# Patient Record
Sex: Female | Born: 1950 | Race: White | Hispanic: No | Marital: Married | Smoking: Never smoker
Health system: Southern US, Community
[De-identification: ages and names within clinical notes are randomized; demographics above are authoritative.]

## PROBLEM LIST (undated history)

## (undated) DIAGNOSIS — F419 Anxiety disorder, unspecified: Secondary | ICD-10-CM

## (undated) DIAGNOSIS — K5790 Diverticulosis of intestine, part unspecified, without perforation or abscess without bleeding: Secondary | ICD-10-CM

## (undated) DIAGNOSIS — K5900 Constipation, unspecified: Secondary | ICD-10-CM

## (undated) DIAGNOSIS — Z6832 Body mass index (BMI) 32.0-32.9, adult: Secondary | ICD-10-CM

## (undated) DIAGNOSIS — J309 Allergic rhinitis, unspecified: Secondary | ICD-10-CM

## (undated) DIAGNOSIS — R12 Heartburn: Secondary | ICD-10-CM

## (undated) DIAGNOSIS — K219 Gastro-esophageal reflux disease without esophagitis: Secondary | ICD-10-CM

## (undated) DIAGNOSIS — I6529 Occlusion and stenosis of unspecified carotid artery: Secondary | ICD-10-CM

## (undated) DIAGNOSIS — E559 Vitamin D deficiency, unspecified: Secondary | ICD-10-CM

## (undated) DIAGNOSIS — R1310 Dysphagia, unspecified: Secondary | ICD-10-CM

## (undated) DIAGNOSIS — E669 Obesity, unspecified: Secondary | ICD-10-CM

## (undated) DIAGNOSIS — I2510 Atherosclerotic heart disease of native coronary artery without angina pectoris: Secondary | ICD-10-CM

## (undated) DIAGNOSIS — Z780 Asymptomatic menopausal state: Secondary | ICD-10-CM

## (undated) DIAGNOSIS — R0602 Shortness of breath: Secondary | ICD-10-CM

## (undated) DIAGNOSIS — J302 Other seasonal allergic rhinitis: Secondary | ICD-10-CM

## (undated) DIAGNOSIS — I10 Essential (primary) hypertension: Secondary | ICD-10-CM

## (undated) DIAGNOSIS — H9190 Unspecified hearing loss, unspecified ear: Secondary | ICD-10-CM

## (undated) DIAGNOSIS — E785 Hyperlipidemia, unspecified: Secondary | ICD-10-CM

## (undated) DIAGNOSIS — K829 Disease of gallbladder, unspecified: Secondary | ICD-10-CM

## (undated) DIAGNOSIS — Z9622 Myringotomy tube(s) status: Secondary | ICD-10-CM

## (undated) HISTORY — DX: Gastro-esophageal reflux disease without esophagitis: K21.9

## (undated) HISTORY — DX: Myringotomy tube(s) status: Z96.22

## (undated) HISTORY — DX: Vitamin D deficiency, unspecified: E55.9

## (undated) HISTORY — DX: Occlusion and stenosis of unspecified carotid artery: I65.29

## (undated) HISTORY — DX: Constipation, unspecified: K59.00

## (undated) HISTORY — DX: Unspecified hearing loss, unspecified ear: H91.90

## (undated) HISTORY — DX: Obesity, unspecified: E66.9

## (undated) HISTORY — DX: Disease of gallbladder, unspecified: K82.9

## (undated) HISTORY — DX: Asymptomatic menopausal state: Z78.0

## (undated) HISTORY — DX: Heartburn: R12

## (undated) HISTORY — DX: Shortness of breath: R06.02

## (undated) HISTORY — DX: Hyperlipidemia, unspecified: E78.5

## (undated) HISTORY — DX: Dysphagia, unspecified: R13.10

## (undated) HISTORY — DX: Anxiety disorder, unspecified: F41.9

## (undated) HISTORY — DX: Allergic rhinitis, unspecified: J30.9

## (undated) HISTORY — DX: Diverticulosis of intestine, part unspecified, without perforation or abscess without bleeding: K57.90

## (undated) HISTORY — DX: Essential (primary) hypertension: I10

## (undated) HISTORY — DX: Atherosclerotic heart disease of native coronary artery without angina pectoris: I25.10

## (undated) HISTORY — DX: Other seasonal allergic rhinitis: J30.2

## (undated) HISTORY — PX: TONSILLECTOMY: SUR1361

## (undated) HISTORY — DX: Body mass index (BMI) 32.0-32.9, adult: Z68.32

## (undated) HISTORY — PX: EXTERNAL EAR SURGERY: SHX627

---

## 1998-10-18 ENCOUNTER — Other Ambulatory Visit: Admission: RE | Admit: 1998-10-18 | Discharge: 1998-10-18 | Payer: Self-pay | Admitting: Obstetrics and Gynecology

## 1999-12-10 ENCOUNTER — Other Ambulatory Visit: Admission: RE | Admit: 1999-12-10 | Discharge: 1999-12-10 | Payer: Self-pay | Admitting: Obstetrics and Gynecology

## 2001-05-20 ENCOUNTER — Other Ambulatory Visit: Admission: RE | Admit: 2001-05-20 | Discharge: 2001-05-20 | Payer: Self-pay | Admitting: Obstetrics and Gynecology

## 2001-09-06 ENCOUNTER — Encounter: Admission: RE | Admit: 2001-09-06 | Discharge: 2001-09-06 | Payer: Self-pay

## 2001-09-10 ENCOUNTER — Encounter: Payer: Self-pay | Admitting: Obstetrics and Gynecology

## 2001-09-10 ENCOUNTER — Ambulatory Visit (HOSPITAL_COMMUNITY): Admission: RE | Admit: 2001-09-10 | Discharge: 2001-09-10 | Payer: Self-pay | Admitting: Obstetrics and Gynecology

## 2002-08-30 ENCOUNTER — Other Ambulatory Visit: Admission: RE | Admit: 2002-08-30 | Discharge: 2002-08-30 | Payer: Self-pay | Admitting: Obstetrics and Gynecology

## 2002-09-05 ENCOUNTER — Encounter: Payer: Self-pay | Admitting: Obstetrics and Gynecology

## 2002-09-05 ENCOUNTER — Ambulatory Visit (HOSPITAL_COMMUNITY): Admission: RE | Admit: 2002-09-05 | Discharge: 2002-09-05 | Payer: Self-pay | Admitting: Obstetrics and Gynecology

## 2003-02-02 ENCOUNTER — Ambulatory Visit (HOSPITAL_COMMUNITY): Admission: RE | Admit: 2003-02-02 | Discharge: 2003-02-02 | Payer: Self-pay | Admitting: Surgery

## 2003-02-13 ENCOUNTER — Ambulatory Visit (HOSPITAL_COMMUNITY): Admission: RE | Admit: 2003-02-13 | Discharge: 2003-02-13 | Payer: Self-pay | Admitting: Family Medicine

## 2003-02-13 ENCOUNTER — Encounter: Payer: Self-pay | Admitting: Surgery

## 2003-10-10 ENCOUNTER — Other Ambulatory Visit: Admission: RE | Admit: 2003-10-10 | Discharge: 2003-10-10 | Payer: Self-pay | Admitting: Obstetrics and Gynecology

## 2004-05-27 ENCOUNTER — Ambulatory Visit (HOSPITAL_COMMUNITY): Admission: RE | Admit: 2004-05-27 | Discharge: 2004-05-27 | Payer: Self-pay | Admitting: Surgery

## 2004-06-04 ENCOUNTER — Ambulatory Visit (HOSPITAL_COMMUNITY): Admission: RE | Admit: 2004-06-04 | Discharge: 2004-06-04 | Payer: Self-pay | Admitting: Surgery

## 2005-02-11 ENCOUNTER — Other Ambulatory Visit: Admission: RE | Admit: 2005-02-11 | Discharge: 2005-02-11 | Payer: Self-pay | Admitting: Obstetrics and Gynecology

## 2006-05-12 HISTORY — PX: PARATHYROIDECTOMY: SHX19

## 2006-09-22 ENCOUNTER — Encounter: Admission: RE | Admit: 2006-09-22 | Discharge: 2006-09-22 | Payer: Self-pay | Admitting: Surgery

## 2006-10-15 ENCOUNTER — Ambulatory Visit (HOSPITAL_COMMUNITY): Admission: RE | Admit: 2006-10-15 | Discharge: 2006-10-16 | Payer: Self-pay | Admitting: Surgery

## 2006-10-15 ENCOUNTER — Encounter (INDEPENDENT_AMBULATORY_CARE_PROVIDER_SITE_OTHER): Payer: Self-pay | Admitting: Surgery

## 2007-10-11 IMAGING — CR DG CHEST 2V
2 series · 2 of 2 positions shown · non-contrast
Comparison: none

CLINICAL DATA: Primary hyperparathyroidism.  Hypertension.  Preop work-up. 
 CHEST - 2 VIEW:

[view not recorded (1 of 2)]
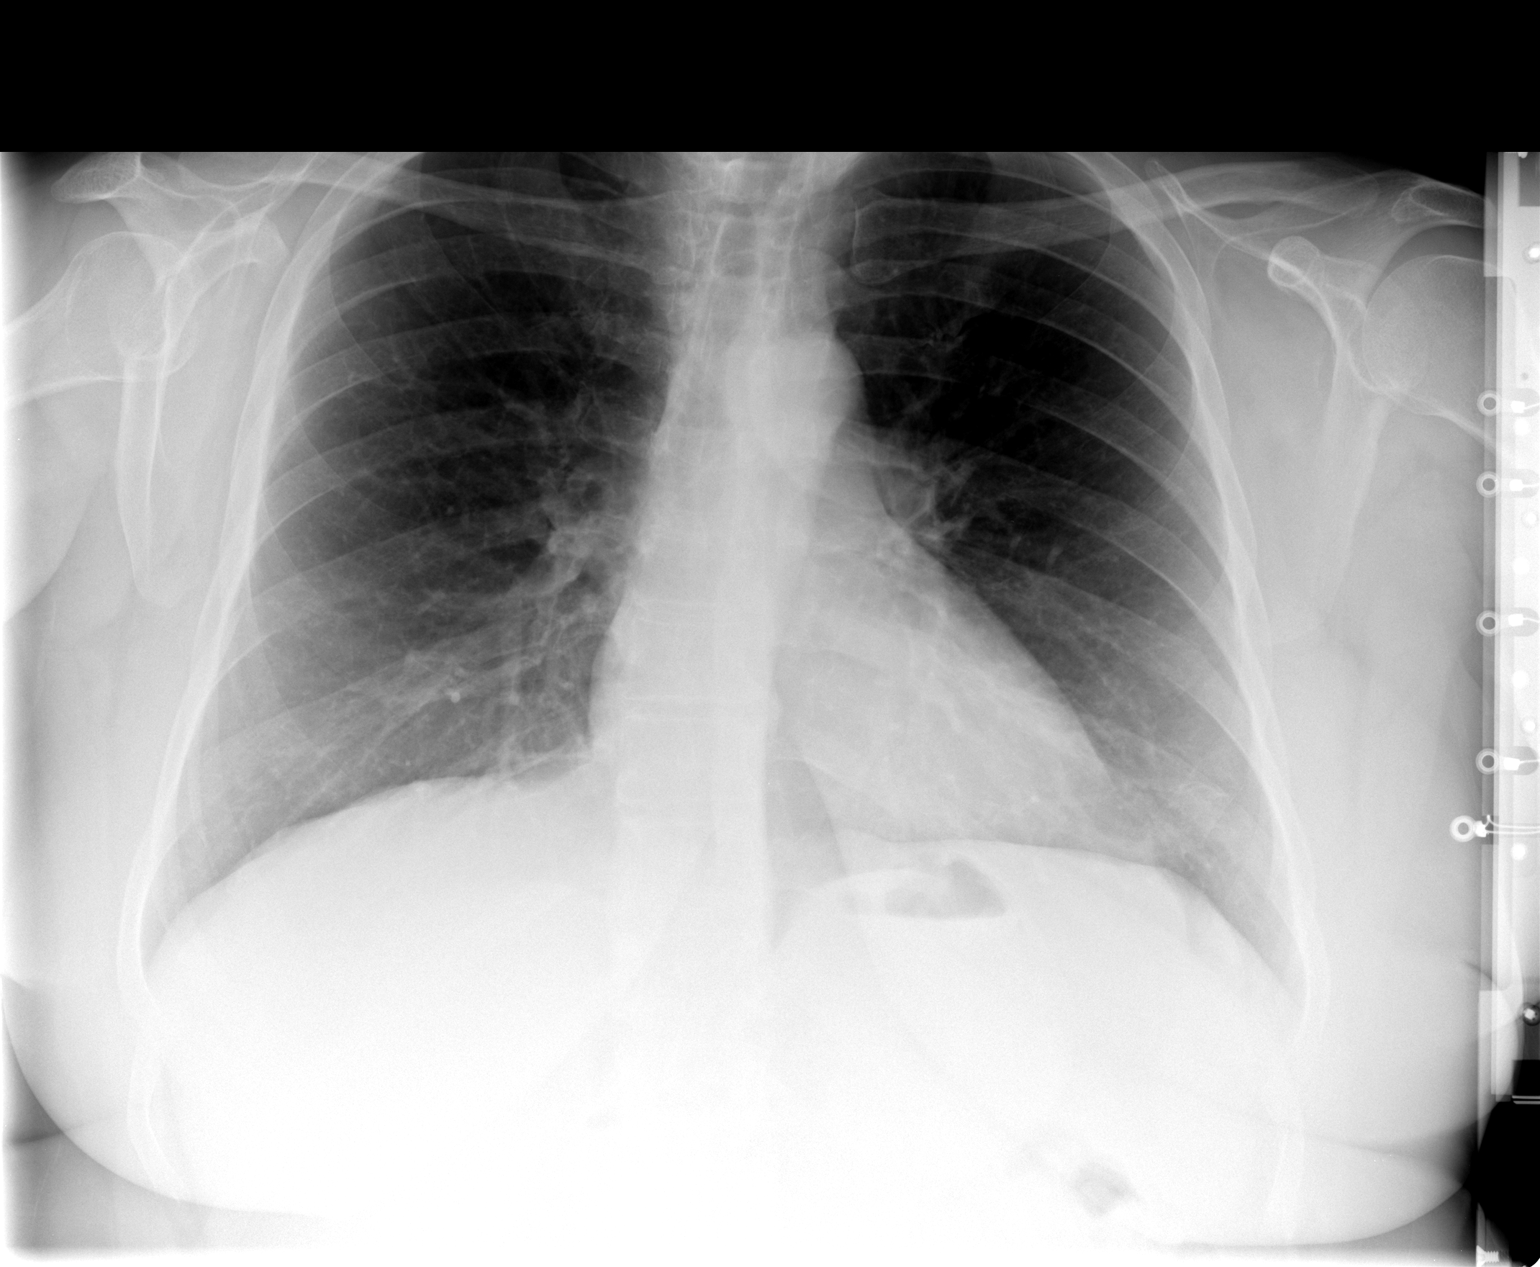

[view not recorded (2 of 2)]
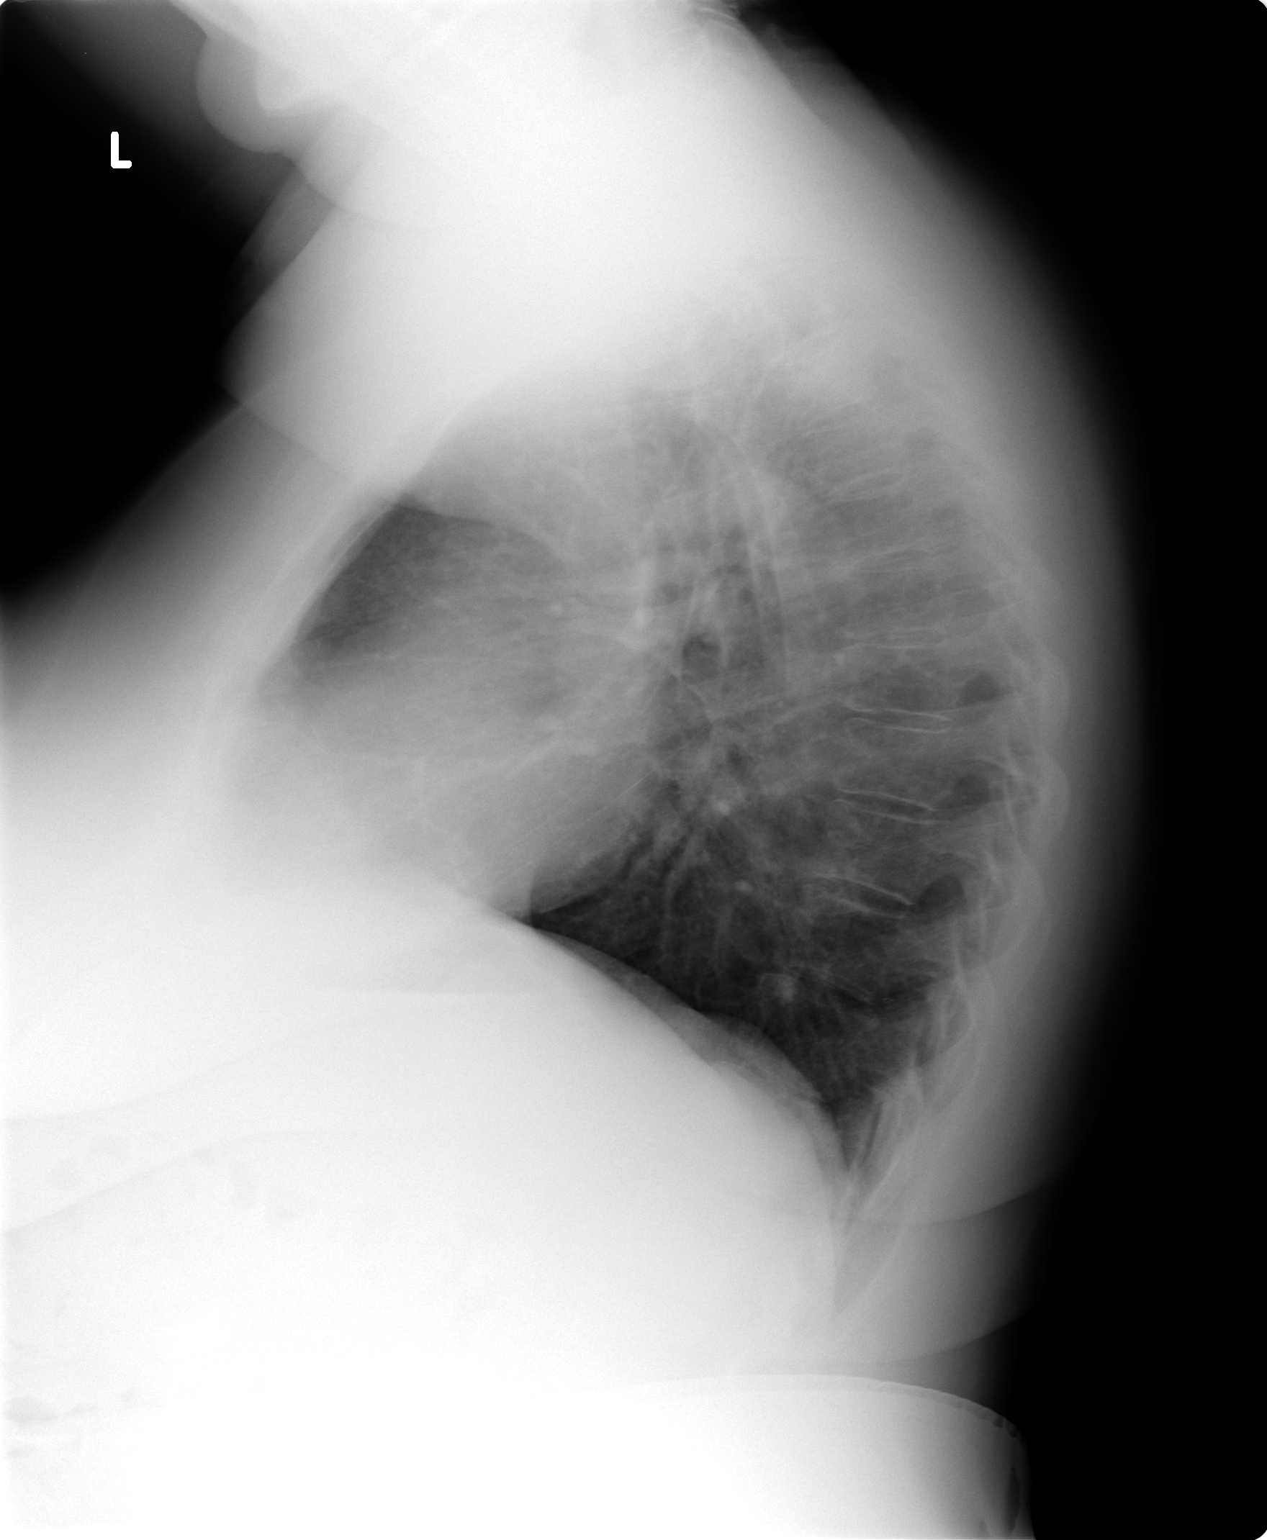

[2 of 2 positions shown; findings below may reference images not displayed]

FINDINGS: The heart size and mediastinal contours are within normal limits.  Both lungs are clear.  The visualized skeletal structures are unremarkable.
IMPRESSION: No active cardiopulmonary disease.

## 2010-09-24 NOTE — Op Note (Signed)
Victoria Torres, Victoria Torres               ACCOUNT NO.:  [id number]   MEDICAL RECORD NO.:  [id number]          PATIENT TYPE:  AMB   LOCATION:  DAY                          FACILITY:  [facility]   PHYSICIAN:  Velora Heckler, MD      DATE OF BIRTH:  [birth year]   DATE OF PROCEDURE:  10/15/2006  DATE OF DISCHARGE:                               OPERATIVE REPORT   PREOPERATIVE DIAGNOSIS:  Primary hyperparathyroidism.   POSTOPERATIVE DIAGNOSIS:  Primary hyperparathyroidism.   PROCEDURE:  Right inferior minimally invasive parathyroidectomy.   SURGEON:  Velora Heckler, M.D., FACS   ANESTHESIA:  General.   ESTIMATED BLOOD LOSS:  Minimal.   PREPARATION:  Betadine.   COMPLICATIONS:  None.   INDICATIONS:  The patient is a 60 year old white female who has been  followed for hypercalcemia in my practice for a number of years.  The  patient had continued elevation of calcium to 10.8.  A recent intact PTH  level was elevated at 230.  At my request, she underwent a sestamibi  scan at [facility] on Sep 22, 2006.  This showed abnormal  radiotracer uptake in the right inferior position consistent with  parathyroid adenoma.  The patient now comes to surgery for minimally  invasive resection.   BODY OF REPORT:  The procedure is done in OR 3 at the [facility].  The patient is brought to the operating room and placed in a  supine position on the operating room table.  Following administration  of general anesthesia, the patient is positioned and then prepped and  draped in the usual strict aseptic fashion.  After ascertaining that an  adequate level of anesthesia had been obtained, a right inferior neck  incision was made with a #15 blade.  Dissection was carried through  subcutaneous tissues and platysma.  Hemostasis was obtained with  electrocautery.  Subplatysmal flaps were developed circumferentially.  A  Weitlaner retractor was placed for exposure.  Strap muscles were incised  in the  midline and reflected laterally.  The inferior pole of the right  thyroid lobe was exposed.  With gentle dissection in the  tracheoesophageal groove, the right lobe was is mobilized cephalad and  anteriorly.  Immediately posterior to the right thyroid lobe is an  abnormally enlarged parathyroid gland.  This was gently dissected out.  Vascular pedicles were divided between small Liga clips.  The gland is  excised off the posterior aspect of the inferior portion of the right  thyroid lobe.  The entire gland is excised.  It measures approximately 1  cm in greatest dimension.  It is submitted to pathology where frozen  section confirms parathyroid tissue consistent with adenoma.  Good  hemostasis is noted.  A small piece of Surgicel was placed in the  operative field.  The strap muscles were reapproximated in the midline  with interrupted 3-0 Vicryl sutures.  The platysma was closed with  interrupted 3-0 Vicryl sutures.  The skin was anesthetized with local  Marcaine.  The skin was closed  with a running 4-0 Monocryl subcuticular suture.  The wound  is washed  and dried and Benzoin and Steri-Strips were applied.  Sterile dressings  were applied.  The patient is awakened from anesthesia and brought to  the recovery room in stable condition.  The patient tolerated the  procedure well.      Velora Heckler, MD  Electronically Signed     TMG/MEDQ  D:  10/15/2006  T:  10/15/2006  Job:  [id number]   cc:   Juline Patch, M.D.  Fax: [phone]

## 2011-02-27 LAB — URINALYSIS, ROUTINE W REFLEX MICROSCOPIC
Bilirubin Urine: NEGATIVE
Hgb urine dipstick: NEGATIVE
Ketones, ur: NEGATIVE
Protein, ur: NEGATIVE
Urobilinogen, UA: 0.2

## 2011-02-27 LAB — PROTIME-INR: Prothrombin Time: 12.2

## 2011-02-27 LAB — DIFFERENTIAL
Basophils Absolute: 0
Eosinophils Absolute: 0.1
Eosinophils Relative: 1
Lymphocytes Relative: 30

## 2011-02-27 LAB — BASIC METABOLIC PANEL
BUN: 12
GFR calc non Af Amer: >60
Glucose, Bld: 98
Potassium: 4.1

## 2011-02-27 LAB — CBC
HCT: 44.7
MCV: 84.5
Platelets: 385
RDW: 13.2

## 2013-05-19 ENCOUNTER — Other Ambulatory Visit: Payer: Self-pay | Admitting: Obstetrics and Gynecology

## 2014-10-20 ENCOUNTER — Other Ambulatory Visit: Payer: Self-pay | Admitting: Obstetrics and Gynecology

## 2014-10-23 LAB — CYTOLOGY - PAP

## 2015-08-20 ENCOUNTER — Other Ambulatory Visit: Payer: Self-pay

## 2016-01-11 HISTORY — PX: CHOLECYSTECTOMY: SHX55

## 2016-01-25 DIAGNOSIS — Z09 Encounter for follow-up examination after completed treatment for conditions other than malignant neoplasm: Secondary | ICD-10-CM | POA: Insufficient documentation

## 2016-01-25 HISTORY — DX: Encounter for follow-up examination after completed treatment for conditions other than malignant neoplasm: Z09

## 2016-03-04 ENCOUNTER — Other Ambulatory Visit: Payer: Self-pay | Admitting: Obstetrics and Gynecology

## 2016-03-05 LAB — CYTOLOGY - PAP

## 2018-07-30 ENCOUNTER — Other Ambulatory Visit: Payer: Self-pay

## 2018-07-30 NOTE — Patient Outreach (Signed)
[facility]) Care Management  07/30/2018  ZULY LOMBARDOZZI May 09, 1951 [id number]   Medication Adherence call to Mrs. Keanah Safer left a message for patient to call back patient is due on Atorvastatin 10 mg under [employer].   Lillia Abed CPhT Pharmacy Technician [facility] Management Direct Dial [phone]  Fax [phone] [email or web address]

## 2021-11-11 DIAGNOSIS — E7800 Pure hypercholesterolemia, unspecified: Secondary | ICD-10-CM | POA: Insufficient documentation

## 2021-11-11 DIAGNOSIS — I10 Essential (primary) hypertension: Secondary | ICD-10-CM | POA: Insufficient documentation

## 2021-11-11 HISTORY — DX: Pure hypercholesterolemia, unspecified: E78.00

## 2021-11-11 HISTORY — DX: Essential (primary) hypertension: I10

## 2022-02-18 ENCOUNTER — Other Ambulatory Visit: Payer: Self-pay

## 2022-02-18 DIAGNOSIS — R5383 Other fatigue: Secondary | ICD-10-CM

## 2022-02-18 HISTORY — DX: Other fatigue: R53.83

## 2022-02-19 ENCOUNTER — Other Ambulatory Visit: Payer: Self-pay

## 2022-02-19 DIAGNOSIS — E785 Hyperlipidemia, unspecified: Secondary | ICD-10-CM | POA: Insufficient documentation

## 2022-02-19 DIAGNOSIS — K219 Gastro-esophageal reflux disease without esophagitis: Secondary | ICD-10-CM | POA: Insufficient documentation

## 2022-02-19 DIAGNOSIS — Z780 Asymptomatic menopausal state: Secondary | ICD-10-CM | POA: Insufficient documentation

## 2022-02-19 DIAGNOSIS — I10 Essential (primary) hypertension: Secondary | ICD-10-CM | POA: Insufficient documentation

## 2022-02-19 DIAGNOSIS — Z6832 Body mass index (BMI) 32.0-32.9, adult: Secondary | ICD-10-CM | POA: Insufficient documentation

## 2022-02-19 DIAGNOSIS — E669 Obesity, unspecified: Secondary | ICD-10-CM | POA: Insufficient documentation

## 2022-02-19 DIAGNOSIS — R1310 Dysphagia, unspecified: Secondary | ICD-10-CM | POA: Insufficient documentation

## 2022-02-19 DIAGNOSIS — J309 Allergic rhinitis, unspecified: Secondary | ICD-10-CM | POA: Insufficient documentation

## 2022-02-19 DIAGNOSIS — I2510 Atherosclerotic heart disease of native coronary artery without angina pectoris: Secondary | ICD-10-CM | POA: Insufficient documentation

## 2022-02-19 DIAGNOSIS — E559 Vitamin D deficiency, unspecified: Secondary | ICD-10-CM | POA: Insufficient documentation

## 2022-02-20 ENCOUNTER — Encounter: Payer: Self-pay | Admitting: Cardiology

## 2022-02-20 ENCOUNTER — Ambulatory Visit: Payer: Medicare Other | Attending: Cardiology | Admitting: Cardiology

## 2022-02-20 VITALS — BP 132/68 | HR 71 | Ht 59.5 in | Wt 159.0 lb

## 2022-02-20 DIAGNOSIS — R072 Precordial pain: Secondary | ICD-10-CM

## 2022-02-20 DIAGNOSIS — E7800 Pure hypercholesterolemia, unspecified: Secondary | ICD-10-CM | POA: Diagnosis not present

## 2022-02-20 DIAGNOSIS — I2510 Atherosclerotic heart disease of native coronary artery without angina pectoris: Secondary | ICD-10-CM | POA: Diagnosis not present

## 2022-02-20 DIAGNOSIS — I10 Essential (primary) hypertension: Secondary | ICD-10-CM | POA: Diagnosis not present

## 2022-02-20 MED ORDER — METOPROLOL TARTRATE 100 MG PO TABS: 100.0000 mg | ORAL_TABLET | Freq: Once | ORAL | 0 refills | Status: DC

## 2022-02-20 NOTE — Patient Instructions (Signed)
Medication Instructions:  Your physician recommends that you continue on your current medications as directed. Please refer to the Current Medication list given to you today.  *If you need a refill on your cardiac medications before your next appointment, please call your pharmacy*   Lab Work: Your physician recommends that you return for lab work in:   Labs today: BMP, Lpa Labs 1 week before CT: BMP  If you have labs (blood work) drawn today and your tests are completely normal, you will receive your results only by: MyChart Message (if you have MyChart) OR A paper copy in the mail If you have any lab test that is abnormal or we need to change your treatment, we will call you to review the results.   Testing/Procedures: Your physician has requested that you have cardiac CT. Cardiac computed tomography (CT) is a painless test that uses an x-ray machine to take clear, detailed pictures of your heart. For further information please visit [email or web address] Please follow instruction sheet as given.    Your Cardiac CT will be scheduled at:   [facility] located off [facility] at the hospital.  Please arrive 30 minutes prior to your appointment time.  You can use the FREE valet parking offered at entrance to outpatient center (encouraged to control the heart rate for the test)   Please follow these instructions carefully (unless otherwise directed):  On the Night Before the Test: Be sure to Drink plenty of water. Do not consume any caffeinated/decaffeinated beverages or chocolate 12 hours prior to your test. Do not take any antihistamines 12 hours prior to your test.  On the Day of the Test: Drink plenty of water until 1 hour prior to the test. Do not eat any food 4 hours prior to the test. No smoking 4 hours prior to test. You may take your regular medications prior to the test.  Take metoprolol (Lopressor) two hours prior to test. FEMALES- please wear  underwire-free bra if available, avoid dresses & tight clothing. Wear plain shirt no beads, sparkles, rhinestones, metal or heavy embroidery.  After the Test: Drink plenty of water. After receiving IV contrast, you may experience a mild flushed feeling. This is normal. On occasion, you may experience a mild rash up to 24 hours after the test. This is not dangerous. If this occurs, you can take Benadryl 25 mg and increase your fluid intake. If you experience trouble breathing, this can be serious. If it is severe call 911 IMMEDIATELY. If it is mild, please call our office. If you take any of these medications: Glipizide/Metformin, Avandament, Glucavance, please do not take 48 hours after completing test unless otherwise instructed.  We will call to schedule your test 2-4 weeks out understanding that some insurance companies will need an authorization prior to the service being performed.      Follow-Up: At [facility], you and your health needs are our priority.  As part of our continuing mission to provide you with exceptional heart care, we have created designated Provider Care Teams.  These Care Teams include your primary Cardiologist (physician) and Advanced Practice Providers (APPs -  Physician Assistants and Nurse Practitioners) who all work together to provide you with the care you need, when you need it.  We recommend signing up for the patient portal called "MyChart".  Sign up information is provided on this After Visit Summary.  MyChart is used to connect with patients for Virtual Visits (Telemedicine).  Patients are able to  view lab/test results, encounter notes, upcoming appointments, etc.  Non-urgent messages can be sent to your provider as well.   To learn more about what you can do with MyChart, go to [email or web address].    Your next appointment:   8 week(s)  The format for your next appointment:   In Person  Provider:   Norman Herrlich, MD    Other  Instructions Cardiac CT Angiogram A cardiac CT angiogram is a procedure to look at the heart and the area around the heart. It may be done to help find the cause of chest pains or other symptoms of heart disease. During this procedure, a substance called contrast dye is injected into the blood vessels in the area to be checked. A large X-ray machine, called a CT scanner, then takes detailed pictures of the heart and the surrounding area. The procedure is also sometimes called a coronary CT angiogram, coronary artery scanning, or CTA. A cardiac CT angiogram allows the health care provider to see how well blood is flowing to and from the heart. The health care provider will be able to see if there are any problems, such as: Blockage or narrowing of the coronary arteries in the heart. Fluid around the heart. Signs of weakness or disease in the muscles, valves, and tissues of the heart. Tell a health care provider about: Any allergies you have. This is especially important if you have had a previous allergic reaction to contrast dye. All medicines you are taking, including vitamins, herbs, eye drops, creams, and over-the-counter medicines. Any blood disorders you have. Any surgeries you have had. Any medical conditions you have. Whether you are pregnant or may be pregnant. Any anxiety disorders, chronic pain, or other conditions you have that may increase your stress or prevent you from lying still. What are the risks? Generally, this is a safe procedure. However, problems may occur, including: Bleeding. Infection. Allergic reactions to medicines or dyes. Damage to other structures or organs. Kidney damage from the contrast dye that is used. Increased risk of cancer from radiation exposure. This risk is low. Talk with your health care provider about: The risks and benefits of testing. How you can receive the lowest dose of radiation. What happens before the procedure? Wear comfortable clothing  and remove any jewelry, glasses, dentures, and hearing aids. Follow instructions from your health care provider about eating and drinking. This may include: For 12 hours before the procedure -- avoid caffeine. This includes tea, coffee, soda, energy drinks, and diet pills. Drink plenty of water or other fluids that do not have caffeine in them. Being well hydrated can prevent complications. For 4-6 hours before the procedure -- stop eating and drinking. The contrast dye can cause nausea, but this is less likely if your stomach is empty. Ask your health care provider about changing or stopping your regular medicines. This is especially important if you are taking diabetes medicines, blood thinners, or medicines to treat problems with erections (erectile dysfunction). What happens during the procedure?  Hair on your chest may need to be removed so that small sticky patches called electrodes can be placed on your chest. These will transmit information that helps to monitor your heart during the procedure. An IV will be inserted into one of your veins. You might be given a medicine to control your heart rate during the procedure. This will help to ensure that good images are obtained. You will be asked to lie on an exam table. This table will slide  in and out of the CT machine during the procedure. Contrast dye will be injected into the IV. You might feel warm, or you may get a metallic taste in your mouth. You will be given a medicine called nitroglycerin. This will relax or dilate the arteries in your heart. The table that you are lying on will move into the CT machine tunnel for the scan. The person running the machine will give you instructions while the scans are being done. You may be asked to: Keep your arms above your head. Hold your breath. Stay very still, even if the table is moving. When the scanning is complete, you will be moved out of the machine. The IV will be removed. The procedure  may vary among health care providers and hospitals. What can I expect after the procedure? After your procedure, it is common to have: A metallic taste in your mouth from the contrast dye. A feeling of warmth. A headache from the nitroglycerin. Follow these instructions at home: Take over-the-counter and prescription medicines only as told by your health care provider. If you are told, drink enough fluid to keep your urine pale yellow. This will help to flush the contrast dye out of your body. Most people can return to their normal activities right after the procedure. Ask your health care provider what activities are safe for you. It is up to you to get the results of your procedure. Ask your health care provider, or the department that is doing the procedure, when your results will be ready. Keep all follow-up visits as told by your health care provider. This is important. Contact a health care provider if: You have any symptoms of allergy to the contrast dye. These include: Shortness of breath. Rash or hives. A racing heartbeat. Summary A cardiac CT angiogram is a procedure to look at the heart and the area around the heart. It may be done to help find the cause of chest pains or other symptoms of heart disease. During this procedure, a large X-ray machine, called a CT scanner, takes detailed pictures of the heart and the surrounding area after a contrast dye has been injected into blood vessels in the area. Ask your health care provider about changing or stopping your regular medicines before the procedure. This is especially important if you are taking diabetes medicines, blood thinners, or medicines to treat erectile dysfunction. If you are told, drink enough fluid to keep your urine pale yellow. This will help to flush the contrast dye out of your body. This information is not intended to replace advice given to you by your health care provider. Make sure you discuss any questions you  have with your health care provider. Document Revised: 08/15/2021 Document Reviewed: 12/22/2018 Elsevier Patient Education  [place]

## 2022-02-20 NOTE — Progress Notes (Signed)
Cardiology Office Note:    Date:  02/20/2022   ID:  Victoria Torres, DOB 11-26-1950, MRN [id number]  PCP:  Hurshel Party, NP  Cardiologist:  Norman Herrlich, MD   Referring MD: Hurshel Party, NP  ASSESSMENT:    1. Coronary artery calcification seen on CT scan   2. Hypertension, essential   3. Hypercholesterolemia    PLAN:    In order of problems listed above:  Although not quantitative the description certainly is consistent with severe coronary artery calcification on CT scan likely associated with a calcium score greater than 300 coronary equivalent and with her history of chest pain and previous medical interactions will undergo cardiac CTA to define the presence or absence of obstructive CAD and guide therapy in the event she did need revascularization.  For now she will continue both aspirin and her statin. Blood pressures are well controlled she has a valid device good technique and averages 120-125/70-74 continue her beta-blocker and ACE inhibitor Continue her high intensity statin clearly indicated with coronary artery calcification if she has CAD we can add Zetia to achieve LDLs less than 70 if she does not tolerate higher dose Lipitor with muscle pain I told her to reduce the dose back to 10 mg daily  Next appointment 6-8 weeks   Medication Adjustments/Labs and Tests Ordered: Current medicines are reviewed at length with the patient today.  Concerns regarding medicines are outlined above.  No orders of the defined types were placed in this encounter.  No orders of the defined types were placed in this encounter.    Chief Complaint  Patient presents with   CAC    History of Present Illness:    Victoria Torres is a 71 y.o. female hypertension hyperlipidemia and lung nodule who is being seen today for the evaluation of coronary artery calcification on CT scan at the request of Moon, Amy A, NP.  She has a chest CT performed 01/23/2021 that showed coronary artery  calcification left anterior descending and right coronary artery.  Chart review shows no previous cardiology evaluation or diagnostic cardiac imaging  She is quite concerned and even anxious as she has heart disease.  She has a strong family history of CAD and has been on a statin somewhere in the range of 8 years.  She has no known history of heart disease congenital rheumatic or atrial fibrillation. She has no exertional chest pain shortness of breath palpitation or syncope but she has had episodes of what she calls indigestion with discomfort up into her chest and tells me about 2 years ago she was seen at [facility] urgent care with an episode of chest pain EKG was normal. Although there is not a quantitative calcium score of the chest CT certainly describing severe coronary calcification generally associated with a calcium score greater than 300 coronary artery equivalent should remain on aspirin should remain on a statin and in view of her chest pain should undergo an ischemia evaluation we discussed option she would like to do cardiac CTA and will be performed at [facility].  She has no dye allergy or renal insufficiency.  I will also screen her for LP(a) we will recheck a BMP today. Past Medical History:  Diagnosis Date   Adult-onset obesity    Allergic rhinitis    Atherosclerosis of native coronary artery of native heart without angina pectoris    BMI 32.0-32.9,adult    Dyslipidemia    Dysphagia    Fatigue 02/18/2022  GERD without esophagitis    Hypercholesterolemia 11/11/2021   Hypertension, essential    Hypertensive disorder 11/11/2021   Postmenopausal    Postoperative examination 01/25/2016   Vitamin D deficiency     Past Surgical History:  Procedure Laterality Date   CHOLECYSTECTOMY  01/2016   PARATHYROIDECTOMY  2008   TONSILLECTOMY      Current Medications: Current Meds  Medication Sig   Ashwagandha 500 MG CAPS Take 500 mg by mouth daily.   aspirin EC 81 MG  tablet Take 81 mg by mouth daily.   atorvastatin (LIPITOR) 20 MG tablet Take 20 mg by mouth daily.   benazepril (LOTENSIN) 20 MG tablet Take 30 mg by mouth daily.   Calcium Carb-Cholecalciferol 500-3.125 MG-MCG TABS Take 1 tablet by mouth daily.   Cholecalciferol 25 MCG (1000 UT) tablet Take 2,000 Units by mouth daily.   fluticasone (FLONASE) 50 MCG/ACT nasal spray Place 1 spray into both nostrils as needed for allergies or rhinitis.   loratadine (CLARITIN) 10 MG tablet Take 1 tablet by mouth daily.   Multiple Vitamin (MULTIVITAMIN PO) Take 1 Pools by mouth daily.   omeprazole (PRILOSEC) 20 MG capsule Take 20 mg by mouth daily.   Probiotic Product (PROBIOTIC PO) Take 1 tablet by mouth daily.   psyllium (METAMUCIL) 58.6 % packet Take 1 packet by mouth daily.   zinc gluconate 50 MG tablet Take 50 mg by mouth daily.     Allergies:   Sulfamethoxazole   Social History   Socioeconomic History   Marital status: Married    Spouse name: Not on file   Number of children: Not on file   Years of education: Not on file   Highest education level: Not on file  Occupational History   Not on file  Tobacco Use   Smoking status: Never   Smokeless tobacco: Never  Substance and Sexual Activity   Alcohol use: Never   Drug use: Never   Sexual activity: Not on file  Other Topics Concern   Not on file  Social History Narrative   Not on file   Social Determinants of Health   Financial Resource Strain: Not on file  Food Insecurity: Not on file  Transportation Needs: Not on file  Physical Activity: Not on file  Stress: Not on file  Social Connections: Not on file     Family History: The patient's family history includes Arthritis in her father and mother; Colon cancer in her maternal grandmother; Diabetes in her mother; Emphysema in her mother; Heart disease in her father; Hyperlipidemia in her mother; Hypertension in her mother; Lung cancer in her father.  ROS:   ROS Please see the history  of present illness.     All other systems reviewed and are negative.  EKGs/Labs/Other Studies Reviewed:    The following studies were reviewed today:   EKG:  EKG is  ordered today.  The ekg ordered today is personally reviewed and demonstrates sinus rhythm late transition in the precordial leads otherwise normal EKG    Physical Exam:    VS:  BP (!) 150/84 (BP Location: Left Arm, Patient Position: Sitting, Cuff Size: Normal)   Ht 4' 11.5" (1.511 m)   Wt 159 lb (72.1 kg)   BMI 31.58 kg/m     Wt Readings from Last 3 Encounters:  02/20/22 159 lb (72.1 kg)    She has no xanthoma or xanthelasma GEN:  Well nourished, well developed in no acute distress HEENT: Normal NECK: No JVD; No carotid  bruits LYMPHATICS: No lymphadenopathy CARDIAC: RRR, no murmurs, rubs, gallops RESPIRATORY:  Clear to auscultation without rales, wheezing or rhonchi  ABDOMEN: Soft, non-tender, non-distended MUSCULOSKELETAL:  No edema; No deformity  SKIN: Warm and dry NEUROLOGIC:  Alert and oriented x 3 PSYCHIATRIC:  Normal affect     Signed, Shirlee More, MD  02/20/2022 9:50 AM    [place]

## 2022-02-21 LAB — BASIC METABOLIC PANEL
BUN/Creatinine Ratio: 19 (ref 12–28)
BUN: 14 mg/dL (ref 8–27)
CO2: 25 mmol/L (ref 20–29)
Calcium: 9.5 mg/dL (ref 8.7–10.3)
Chloride: 99 mmol/L (ref 96–106)
Creatinine, Ser: 0.74 mg/dL (ref 0.57–1.00)
Glucose: 89 mg/dL (ref 70–99)
Potassium: 4.3 mmol/L (ref 3.5–5.2)
Sodium: 139 mmol/L (ref 134–144)
eGFR: 88 mL/min/{1.73_m2} (ref >59)

## 2022-02-21 LAB — LIPOPROTEIN A (LPA): Lipoprotein (a): <8.4 nmol/L (ref <75.0)

## 2022-02-25 ENCOUNTER — Telehealth: Payer: Self-pay

## 2022-02-26 ENCOUNTER — Telehealth: Payer: Self-pay | Admitting: Cardiology

## 2022-02-26 NOTE — Telephone Encounter (Signed)
Patient is returning phone call about results. 

## 2022-03-20 ENCOUNTER — Telehealth: Payer: Self-pay | Admitting: Cardiology

## 2022-03-20 NOTE — Telephone Encounter (Signed)
Spoke with pt. She was seen in the office by Dr. Dulce Sellar on 02-20-22 and she had not heard from CT to schedule CT angiogram. [facility] did not have her scheduled. Sent to Prec[facility]- no precert needed- AH. Forms and records are ready to send to [facility] after a signature. Pt aware. She has Metoprolol and knows to get bloodwork done 1 week prior.

## 2022-03-20 NOTE — Telephone Encounter (Signed)
Patient called stating she called [facility] hospital to schedule her CT Angio and they told her the nurse needs to call to schedule that.

## 2022-04-01 ENCOUNTER — Telehealth: Payer: Self-pay | Admitting: Cardiology

## 2022-04-01 LAB — BASIC METABOLIC PANEL
BUN/Creatinine Ratio: 27 (ref 12–28)
BUN: 20 mg/dL (ref 8–27)
CO2: 27 mmol/L (ref 20–29)
Calcium: 9.3 mg/dL (ref 8.7–10.3)
Chloride: 101 mmol/L (ref 96–106)
Creatinine, Ser: 0.73 mg/dL (ref 0.57–1.00)
Glucose: 90 mg/dL (ref 70–99)
Potassium: 4.4 mmol/L (ref 3.5–5.2)
Sodium: 139 mmol/L (ref 134–144)
eGFR: 89 mL/min/{1.73_m2} (ref >59)

## 2022-04-01 NOTE — Telephone Encounter (Signed)
Gave patient lab results per Dr. Hulen Shouts note.

## 2022-04-01 NOTE — Telephone Encounter (Signed)
    Pt is returning call to get lab results 

## 2022-04-17 ENCOUNTER — Encounter: Payer: Self-pay | Admitting: Cardiology

## 2022-04-17 NOTE — Progress Notes (Signed)
Cardiology Office Note:    Date:  04/18/2022   ID:  Victoria Torres, DOB 1950/08/27, MRN [id number]  PCP:  Hurshel Party, NP  Cardiologist:  Norman Herrlich, MD    Referring MD: Hurshel Party, NP    ASSESSMENT:    1. Mild CAD   2. Agatston coronary artery calcium score between 200 and 399   3. Hypertension, essential   4. Hypercholesterolemia    PLAN:    In order of problems listed above:  Evaluate cardiac CTA as the data given in the opportunities for preventative cardiology care coronary score is quite high she will need to have effective lipid-lowering therapy likely will need Zetia plus high intensity statin goal LDL less than 55 for best long-term outcome and please check LP(a) level. Stable hypertension she will continue with current effective treatment All of her lipids in her PCP office the beginning of February   Next appointment: She will see me in the office in follow-up 1 year   Medication Adjustments/Labs and Tests Ordered: Current medicines are reviewed at length with the patient today.  Concerns regarding medicines are outlined above.  No orders of the defined types were placed in this encounter.  Meds ordered this encounter  Medications   atorvastatin (LIPITOR) 20 MG tablet    Sig: Take 1 tablet (20 mg total) by mouth daily.    Dispense:  90 tablet    Refill:  3    Chief Complaint  Patient presents with   Follow-up    After cardiac CTA    History of Present Illness:    Victoria Torres is a 71 y.o. female with a hx of hypertension hyperlipidemia lung nodule and coronary artery calcification along Family history of CAD last seen 02/20/2022 and referred for cardiac CTA.  Cardiac CTA reported 04/08/2022 she had a calcium score of 272 64th percentile and nonobstructive CAD 25 to 49% mid left anterior descending coronary artery.  Compliance with diet, lifestyle and medications: Yes   She has very good healthcare knowledge and noted already looked at her  cardiac CTA reported unremarkable. Calcium score significantly elevated and for her the goal lipid-lowering therapy should be an LDL of 55 or less.  Dose of atorvastatin have been decreased and I told her to go back to 20 mg daily she has upcoming labs February PCP if LDL exceeds 55 I will place her on Zetia 10 mg daily with her statin and I like to have her screened for LP(a) excess.  Fortunately not having angina edema shortness of breath palpitation or syncope and no significant focal coronary stenosis Past Medical History:  Diagnosis Date   Adult-onset obesity    Allergic rhinitis    Atherosclerosis of native coronary artery of native heart without angina pectoris    BMI 32.0-32.9,adult    Dyslipidemia    Dysphagia    Fatigue 02/18/2022   GERD without esophagitis    Hypercholesterolemia 11/11/2021   Hypertension, essential    Hypertensive disorder 11/11/2021   Postmenopausal    Postoperative examination 01/25/2016   Vitamin D deficiency     Past Surgical History:  Procedure Laterality Date   CHOLECYSTECTOMY  01/2016   PARATHYROIDECTOMY  2008   TONSILLECTOMY      Current Medications: Current Meds  Medication Sig   Ashwagandha 500 MG CAPS Take 500 mg by mouth daily.   aspirin EC 81 MG tablet Take 81 mg by mouth daily.   atorvastatin (LIPITOR) 20 MG tablet Take 1  tablet (20 mg total) by mouth daily.   benazepril (LOTENSIN) 20 MG tablet Take 30 mg by mouth daily.   Calcium Carb-Cholecalciferol 500-3.125 MG-MCG TABS Take 1 tablet by mouth daily.   Cholecalciferol 25 MCG (1000 UT) tablet Take 2,000 Units by mouth daily.   fluticasone (FLONASE) 50 MCG/ACT nasal spray Place 1 spray into both nostrils as needed for allergies or rhinitis.   loratadine (CLARITIN) 10 MG tablet Take 1 tablet by mouth daily.   metoprolol tartrate (LOPRESSOR) 100 MG tablet Take 1 tablet (100 mg total) by mouth once for 1 dose. Please take 2 hours prior to CT   Multiple Vitamin (MULTIVITAMIN PO) Take 1  Pools by mouth daily.   omeprazole (PRILOSEC) 20 MG capsule Take 20 mg by mouth daily.   Probiotic Product (PROBIOTIC PO) Take 1 tablet by mouth daily.   psyllium (METAMUCIL) 58.6 % packet Take 1 packet by mouth daily.   zinc gluconate 50 MG tablet Take 50 mg by mouth daily.   [DISCONTINUED] atorvastatin (LIPITOR) 20 MG tablet Take 10 mg by mouth daily.     Allergies:   Sulfamethoxazole   Social History   Socioeconomic History   Marital status: Married    Spouse name: Not on file   Number of children: Not on file   Years of education: Not on file   Highest education level: Not on file  Occupational History   Not on file  Tobacco Use   Smoking status: Never   Smokeless tobacco: Never  Substance and Sexual Activity   Alcohol use: Never   Drug use: Never   Sexual activity: Not on file  Other Topics Concern   Not on file  Social History Narrative   Not on file   Social Determinants of Health   Financial Resource Strain: Not on file  Food Insecurity: Not on file  Transportation Needs: Not on file  Physical Activity: Not on file  Stress: Not on file  Social Connections: Not on file     Family History: The patient's family history includes Arthritis in her father and mother; Colon cancer in her maternal grandmother; Diabetes in her mother; Emphysema in her mother; Heart disease in her father; Hyperlipidemia in her mother; Hypertension in her mother; Lung cancer in her father. ROS:   Please see the history of present illness.    All other systems reviewed and are negative.  EKGs/Labs/Other Studies Reviewed:    The following studies were reviewed today:  12/02/2021 cholesterol 168 LDL 84  Physical Exam:    VS:  BP (!) 160/84 (BP Location: Left Arm, Patient Position: Sitting)   Pulse 81   Ht 4' 11.5" (1.511 m)   Wt 157 lb (71.2 kg)   SpO2 96%   BMI 31.18 kg/m     Wt Readings from Last 3 Encounters:  04/18/22 157 lb (71.2 kg)  02/20/22 159 lb (72.1 kg)      GEN:  Well nourished, well developed in no acute distress HEENT: Normal NECK: No JVD; No carotid bruits LYMPHATICS: No lymphadenopathy CARDIAC: RRR, no murmurs, rubs, gallops RESPIRATORY:  Clear to auscultation without rales, wheezing or rhonchi  ABDOMEN: Soft, non-tender, non-distended MUSCULOSKELETAL:  No edema; No deformity  SKIN: Warm and dry NEUROLOGIC:  Alert and oriented x 3 PSYCHIATRIC:  Normal affect    Signed, Norman Herrlich, MD  04/18/2022 8:45 AM    [facility]

## 2022-04-18 ENCOUNTER — Other Ambulatory Visit: Payer: Self-pay

## 2022-04-18 ENCOUNTER — Ambulatory Visit: Payer: Medicare Other | Attending: Cardiology | Admitting: Cardiology

## 2022-04-18 VITALS — BP 160/84 | HR 81 | Ht 59.5 in | Wt 157.0 lb

## 2022-04-18 DIAGNOSIS — I10 Essential (primary) hypertension: Secondary | ICD-10-CM

## 2022-04-18 DIAGNOSIS — I2510 Atherosclerotic heart disease of native coronary artery without angina pectoris: Secondary | ICD-10-CM

## 2022-04-18 DIAGNOSIS — R931 Abnormal findings on diagnostic imaging of heart and coronary circulation: Secondary | ICD-10-CM | POA: Diagnosis not present

## 2022-04-18 DIAGNOSIS — E7800 Pure hypercholesterolemia, unspecified: Secondary | ICD-10-CM | POA: Diagnosis not present

## 2022-04-18 MED ORDER — ATORVASTATIN CALCIUM 20 MG PO TABS: 20.0000 mg | ORAL_TABLET | Freq: Every day | ORAL | 3 refills | Status: DC

## 2022-04-18 NOTE — Patient Instructions (Signed)
Medication Instructions:  Your physician has recommended you make the following change in your medication:   START: Atorvastatin 20 mg daily  *If you need a refill on your cardiac medications before your next appointment, please call your pharmacy*   Lab Work: None If you have labs (blood work) drawn today and your tests are completely normal, you will receive your results only by: MyChart Message (if you have MyChart) OR A paper copy in the mail If you have any lab test that is abnormal or we need to change your treatment, we will call you to review the results.   Testing/Procedures: None   Follow-Up: At [facility], you and your health needs are our priority.  As part of our continuing mission to provide you with exceptional heart care, we have created designated Provider Care Teams.  These Care Teams include your primary Cardiologist (physician) and Advanced Practice Providers (APPs -  Physician Assistants and Nurse Practitioners) who all work together to provide you with the care you need, when you need it.  We recommend signing up for the patient portal called "MyChart".  Sign up information is provided on this After Visit Summary.  MyChart is used to connect with patients for Virtual Visits (Telemedicine).  Patients are able to view lab/test results, encounter notes, upcoming appointments, etc.  Non-urgent messages can be sent to your provider as well.   To learn more about what you can do with MyChart, go to [email or web address].    Your next appointment:   1 year(s)  The format for your next appointment:   In Person  Provider:   Norman Herrlich, MD    Other Instructions 20 - 30 minutes of activity a day.  Important Information About Sugar

## 2023-04-06 NOTE — Progress Notes (Unsigned)
Cardiology Office Note:    Date:  04/06/2023   ID:  Victoria Torres, DOB 21-Mar-1951, MRN [id number]  PCP:  Hurshel Party, NP  Cardiologist:  Norman Herrlich, MD    Referring MD: Hurshel Party, NP    ASSESSMENT:    1. Mild CAD   2. Agatston coronary artery calcium score between 200 and 399   3. Hypertension, essential   4. Hypercholesterolemia    PLAN:    In order of problems listed above:  ***   Next appointment: ***   Medication Adjustments/Labs and Tests Ordered: Current medicines are reviewed at length with the patient today.  Concerns regarding medicines are outlined above.  No orders of the defined types were placed in this encounter.  No orders of the defined types were placed in this encounter.    History of Present Illness:    Victoria Torres is a 72 y.o. female with a hx of mild CAD nonobstructive with a calcium score of 272/64th percentile hypertension hyperlipidemia and lung nodule last seen 04/18/2022. Compliance with diet, lifestyle and medications: *** Past Medical History:  Diagnosis Date   Adult-onset obesity    Allergic rhinitis    Atherosclerosis of native coronary artery of native heart without angina pectoris    BMI 32.0-32.9,adult    Dyslipidemia    Dysphagia    Fatigue 02/18/2022   GERD without esophagitis    Hypercholesterolemia 11/11/2021   Hypertension, essential    Hypertensive disorder 11/11/2021   Postmenopausal    Postoperative examination 01/25/2016   Vitamin D deficiency     Current Medications: Current Meds  Medication Sig   Ashwagandha 500 MG CAPS Take 500 mg by mouth daily.   aspirin EC 81 MG tablet Take 81 mg by mouth daily.   atorvastatin (LIPITOR) 20 MG tablet Take 1 tablet (20 mg total) by mouth daily.   benazepril (LOTENSIN) 20 MG tablet Take 30 mg by mouth daily.   Calcium Carb-Cholecalciferol 500-3.125 MG-MCG TABS Take 1 tablet by mouth daily.   Cholecalciferol 25 MCG (1000 UT) tablet Take 2,000 Units by mouth daily.    fluticasone (FLONASE) 50 MCG/ACT nasal spray Place 1 spray into both nostrils as needed for allergies or rhinitis.   loratadine (CLARITIN) 10 MG tablet Take 1 tablet by mouth daily.   Multiple Vitamin (MULTIVITAMIN PO) Take 1 Pools by mouth daily.   omeprazole (PRILOSEC) 20 MG capsule Take 20 mg by mouth daily.   Probiotic Product (PROBIOTIC PO) Take 1 tablet by mouth daily.   psyllium (METAMUCIL) 58.6 % packet Take 1 packet by mouth daily.   zinc gluconate 50 MG tablet Take 50 mg by mouth daily.   [DISCONTINUED] metoprolol tartrate (LOPRESSOR) 100 MG tablet Take 1 tablet (100 mg total) by mouth once for 1 dose. Please take 2 hours prior to CT      EKGs/Labs/Other Studies Reviewed:    The following studies were reviewed today:          Recent Labs: No results found for requested labs within last 365 days.  Recent Lipid Panel No results found for: "CHOL", "TRIG", "HDL", "CHOLHDL", "VLDL", "LDLCALC", "LDLDIRECT"  Physical Exam:    VS:  There were no vitals taken for this visit.    Wt Readings from Last 3 Encounters:  04/18/22 157 lb (71.2 kg)  02/20/22 159 lb (72.1 kg)     GEN: *** Well nourished, well developed in no acute distress HEENT: Normal NECK: No JVD; No carotid bruits LYMPHATICS: No  lymphadenopathy CARDIAC: ***RRR, no murmurs, rubs, gallops RESPIRATORY:  Clear to auscultation without rales, wheezing or rhonchi  ABDOMEN: Soft, non-tender, non-distended MUSCULOSKELETAL:  No edema; No deformity  SKIN: Warm and dry NEUROLOGIC:  Alert and oriented x 3 PSYCHIATRIC:  Normal affect    Signed, Norman Herrlich, MD  04/06/2023 8:25 PM    [facility]

## 2023-04-07 ENCOUNTER — Encounter: Payer: Self-pay | Admitting: Cardiology

## 2023-04-07 ENCOUNTER — Ambulatory Visit: Payer: Medicare Other | Attending: Cardiology | Admitting: Cardiology

## 2023-04-07 VITALS — BP 148/80 | HR 70 | Ht 59.5 in | Wt 155.0 lb

## 2023-04-07 DIAGNOSIS — I10 Essential (primary) hypertension: Secondary | ICD-10-CM

## 2023-04-07 DIAGNOSIS — R931 Abnormal findings on diagnostic imaging of heart and coronary circulation: Secondary | ICD-10-CM | POA: Diagnosis not present

## 2023-04-07 DIAGNOSIS — E7800 Pure hypercholesterolemia, unspecified: Secondary | ICD-10-CM

## 2023-04-07 DIAGNOSIS — I2510 Atherosclerotic heart disease of native coronary artery without angina pectoris: Secondary | ICD-10-CM | POA: Diagnosis not present

## 2023-04-07 NOTE — Patient Instructions (Signed)
Medication Instructions:  Your physician recommends that you continue on your current medications as directed. Please refer to the Current Medication list given to you today.  *If you need a refill on your cardiac medications before your next appointment, please call your pharmacy*   Lab Work: Your physician recommends that you return for lab work in:   Labs in AM: Apo B, Lipids  If you have labs (blood work) drawn today and your tests are completely normal, you will receive your results only by: MyChart Message (if you have MyChart) OR A paper copy in the mail If you have any lab test that is abnormal or we need to change your treatment, we will call you to review the results.   Testing/Procedures: None   Follow-Up: At [facility], you and your health needs are our priority.  As part of our continuing mission to provide you with exceptional heart care, we have created designated Provider Care Teams.  These Care Teams include your primary Cardiologist (physician) and Advanced Practice Providers (APPs -  Physician Assistants and Nurse Practitioners) who all work together to provide you with the care you need, when you need it.  We recommend signing up for the patient portal called "MyChart".  Sign up information is provided on this After Visit Summary.  MyChart is used to connect with patients for Virtual Visits (Telemedicine).  Patients are able to view lab/test results, encounter notes, upcoming appointments, etc.  Non-urgent messages can be sent to your provider as well.   To learn more about what you can do with MyChart, go to [email or web address].    Your next appointment:   6 month(s)  Provider:   Norman Herrlich, MD    Other Instructions None

## 2023-04-07 NOTE — Progress Notes (Signed)
Cardiology Office Note:    Date:  04/07/2023   ID:  [name], DOB 12/26/1950, MRN [id number]  PCP:  Hurshel Party, NP  Cardiologist:  Norman Herrlich, MD    Referring MD: Hurshel Party, NP    ASSESSMENT:    1. Mild CAD   2. Agatston coronary artery calcium score between 200 and 399   3. Hypertension, essential   4. Hypercholesterolemia    PLAN:    In order of problems listed above:  Stable CAD and no angina with aspirin and her current lipid-lowering therapy along with antihypertensive Recheck a fasting lipid profile in April be tomorrow decide whether to transition to rosuvastatin if LDL remains above target Hypertension well-controlled with ambulatory blood pressure measurement continue ACE inhibitor   Next appointment: 6 months   Medication Adjustments/Labs and Tests Ordered: Current medicines are reviewed at length with the patient today.  Concerns regarding medicines are outlined above.  Orders Placed This Encounter  Procedures   Lipid Profile   Apolipoprotein B   EKG 12-Lead   No orders of the defined types were placed in this encounter.    History of Present Illness:    Victoria Torres is a 72 y.o. female with a hx of CAD hypertension hyperlipidemia last seen 04/18/2022. Compliance with diet, lifestyle and medications: Yes  She has been meticulous with diet and exercise compliant with medications and quite frustrated that her lipids since she initiated Zetia to her statin have worsened cholesterol going from 1 50-134-165 LDL from 74-58-82. She is frustrated and questions whether it is a lab error or medication failure She is using generics I agree with her that I cannot make sense of this organ does have her come back in the office to repeat a fasting profile in ApoB If lipids clearly have worsened we will discontinue atorvastatin got high dose rosuvastatin and if ineffective consider statin intolerance and switch to bempedoic acid or Repatha Otherwise doing  well and not having angina shortness of breath edema chest pain palpitation or syncope Amatory blood pressures run less than 120 systolic Past Medical History:  Diagnosis Date   Adult-onset obesity    Allergic rhinitis    Atherosclerosis of native coronary artery of native heart without angina pectoris    BMI 32.0-32.9,adult    Dyslipidemia    Dysphagia    Fatigue 02/18/2022   GERD without esophagitis    Hypercholesterolemia 11/11/2021   Hypertension, essential    Hypertensive disorder 11/11/2021   Postmenopausal    Postoperative examination 01/25/2016   Vitamin D deficiency     Current Medications: Current Meds  Medication Sig   aspirin EC 81 MG tablet Take 81 mg by mouth daily.   atorvastatin (LIPITOR) 20 MG tablet Take 1 tablet (20 mg total) by mouth daily.   benazepril (LOTENSIN) 20 MG tablet Take 20 mg by mouth 2 (two) times daily.   busPIRone (BUSPAR) 5 MG tablet Take 5 mg by mouth 2 (two) times daily.   Calcium Carb-Cholecalciferol 500-3.125 MG-MCG TABS Take 1 tablet by mouth daily.   Cholecalciferol 25 MCG (1000 UT) tablet Take 2,000 Units by mouth daily.   ezetimibe (ZETIA) 10 MG tablet Take 10 mg by mouth daily.   fluticasone (FLONASE) 50 MCG/ACT nasal spray Place 1 spray into both nostrils as needed for allergies or rhinitis.   loratadine (CLARITIN) 10 MG tablet Take 1 tablet by mouth daily.   Multiple Vitamin (MULTIVITAMIN PO) Take 1 Pools by mouth daily.  omeprazole (PRILOSEC) 20 MG capsule Take 20 mg by mouth daily.   Probiotic Product (PROBIOTIC PO) Take 1 tablet by mouth daily.   psyllium (METAMUCIL) 58.6 % packet Take 1 packet by mouth daily.   [DISCONTINUED] Ashwagandha 500 MG CAPS Take 500 mg by mouth daily.   [DISCONTINUED] metoprolol tartrate (LOPRESSOR) 100 MG tablet Take 1 tablet (100 mg total) by mouth once for 1 dose. Please take 2 hours prior to CT   [DISCONTINUED] zinc gluconate 50 MG tablet Take 50 mg by mouth daily.      EKGs/Labs/Other Studies  Reviewed:    The following studies were reviewed today:      EKG Interpretation Date/Time:  Tuesday April 07 2023 15:59:31 EST Ventricular Rate:  70 PR Interval:  146 QRS Duration:  84 QT Interval:  406 QTC Calculation: 438 R Axis:   -8  Text Interpretation: Normal sinus rhythm Normal ECG When compared with ECG of 13-Oct-2006 14:04, Criteria for Inferior infarct are no longer Present Confirmed by Norman Herrlich ([id number]) on 04/07/2023 5:12:46 PM   Recent Labs: No results found for requested labs within last 365 days.  Recent Lipid Panel No results found for: "CHOL", "TRIG", "HDL", "CHOLHDL", "VLDL", "LDLCALC", "LDLDIRECT"  Physical Exam:    VS:  BP (!) 148/80 (BP Location: Left Arm, Patient Position: Sitting)   Pulse 70   Ht 4' 11.5" (1.511 m)   Wt 155 lb (70.3 kg)   SpO2 96%   BMI 30.78 kg/m     Wt Readings from Last 3 Encounters:  04/07/23 155 lb (70.3 kg)  04/18/22 157 lb (71.2 kg)  02/20/22 159 lb (72.1 kg)     GEN:  Well nourished, well developed in no acute distress HEENT: Normal NECK: No JVD; No carotid bruits LYMPHATICS: No lymphadenopathy CARDIAC: RRR, no murmurs, rubs, gallops RESPIRATORY:  Clear to auscultation without rales, wheezing or rhonchi  ABDOMEN: Soft, non-tender, non-distended MUSCULOSKELETAL:  No edema; No deformity  SKIN: Warm and dry NEUROLOGIC:  Alert and oriented x 3 PSYCHIATRIC:  Normal affect    Signed, Norman Herrlich, MD  04/07/2023 5:14 PM    [facility]

## 2023-04-09 LAB — LIPID PANEL
Chol/HDL Ratio: 2.3 {ratio} (ref 0.0–4.4)
Cholesterol, Total: 137 mg/dL (ref 100–199)
HDL: 60 mg/dL (ref >39)
LDL Chol Calc (NIH): 62 mg/dL (ref 0–99)
Triglycerides: 79 mg/dL (ref 0–149)
VLDL Cholesterol Cal: 15 mg/dL (ref 5–40)

## 2023-04-09 LAB — APOLIPOPROTEIN B: Apolipoprotein B: 66 mg/dL (ref <90)

## 2023-04-13 ENCOUNTER — Other Ambulatory Visit: Payer: Self-pay | Admitting: Cardiology

## 2023-08-18 HISTORY — PX: CATARACT EXTRACTION: SUR2

## 2023-11-05 ENCOUNTER — Ambulatory Visit: Admitting: Cardiology

## 2023-11-26 ENCOUNTER — Encounter (INDEPENDENT_AMBULATORY_CARE_PROVIDER_SITE_OTHER): Payer: Self-pay | Admitting: *Deleted

## 2023-11-27 ENCOUNTER — Ambulatory Visit

## 2023-11-27 VITALS — BP 149/85 | HR 77 | Ht 59.0 in | Wt 159.0 lb

## 2023-11-27 DIAGNOSIS — E7800 Pure hypercholesterolemia, unspecified: Secondary | ICD-10-CM | POA: Diagnosis not present

## 2023-11-27 DIAGNOSIS — R918 Other nonspecific abnormal finding of lung field: Secondary | ICD-10-CM | POA: Insufficient documentation

## 2023-11-27 DIAGNOSIS — I2510 Atherosclerotic heart disease of native coronary artery without angina pectoris: Secondary | ICD-10-CM

## 2023-11-27 DIAGNOSIS — I10 Essential (primary) hypertension: Secondary | ICD-10-CM

## 2023-11-27 HISTORY — DX: Other nonspecific abnormal finding of lung field: R91.8

## 2023-11-27 NOTE — Assessment & Plan Note (Signed)
 Mild nonobstructive coronary artery disease on cardiac CT November 2023.  Continue with aspirin 81 mg once daily and atorvastatin  20 mg once daily. Good functional status asymptomatic.

## 2023-11-27 NOTE — Progress Notes (Signed)
 Normal coronary neurologic  Cardiology Consultation:    Date:  11/27/2023   ID:  Victoria Torres, DOB 31-Mar-1951, MRN [id number]  PCP:  Erick Greig LABOR, NP  Cardiologist:  Alean JONELLE Kobus, MD   Referring MD: Erick Greig LABOR, NP   No chief complaint on file.    ASSESSMENT AND PLAN:   Victoria Torres 73 year old woman with history of mild coronary artery disease [cardiac CT coronary angiogram at [facility] November 2023 CAD RADS 2 study with calcium  score 272, 4 mm right middle lobe pulmonary nodule], hypertension, hyperlipidemia former smoker [smoked for over 2 years about 50 years ago; has history of lung cancer in her father]. I do not see follow-up CT chest imaging study after her cardiac CT to follow-up on lung nodules. Here for follow-up visit doing well no cardiac symptoms.  Blood pressures at home well-controlled in comparison to office visits.   Problem List Items Addressed This Visit     Hypercholesterolemia   Lipid panel November 03, 2023 noted well-controlled levels. Continue atorvastatin  20 mg once daily and Zetia 10 mg once daily. She is diligent with her diet modifications and regular exercise. Congratulated her on improved lipid levels.        Atherosclerosis of native coronary artery of native heart without angina pectoris - Primary   Mild nonobstructive coronary artery disease on cardiac CT November 2023.  Continue with aspirin 81 mg once daily and atorvastatin  20 mg once daily. Good functional status asymptomatic.       Hypertension, essential   Elevated readings here in the office but mentions home readings are better controlled. Continue with benazepril 20 mg twice daily. Continue monitoring at home until readings are consistently above 130/80 mmHg should inform us  so we can further titrate up her blood pressure medications.       Lung nodules   CAD cardiac CT November 2023 identified lung nodule 4 mm.  Given her history of smoking and family  history of lung cancer, follow-up imaging study I do not see anything available. Will order CT chest without contrast      Relevant Orders   CT Chest Wo Contrast    Return to clinic in 6 months.  History of Present Illness:    Victoria Torres is a 73 y.o. female who is being seen today for follow-up visit. PCP is Moon, Amy A, NP. Last visit with us  in the office was 04/07/2023 with Dr. Monetta.  Has mild coronary artery disease [cardiac CT coronary angiogram at [facility] November 2023 CAD RADS 2 study with calcium  score 272, 4 mm right middle lobe pulmonary nodule], hypertension, hyperlipidemia.  Very pleasant woman here for the visit by herself mentions she keeps herself active working 4-5 times a week on elliptical and modifying her diet and keeps herself active. No cardiac symptoms.  Very diligent about monitoring her diet and cholesterol numbers and has been showing improvement as reviewed on recent blood work  Lipid panel 04/08/2023 with total cholesterol 137, triglycerides 79, LDL 62, apolipoprotein B 66, HDL 60. Recent blood work repeat from 11/03/2023 noted on [facility] noted total cholesterol 148, HDL 68, LDL 65, triglycerides 80. CMP 11/03/2023 BUN 16, creatinine 0.7, EGFR 93 and normal transaminitis and alkaline phosphatase  Continue with current medications. Blood pressures at home mentions have been well-controlled using a blood pressure cuff that has been calibrated at PCPs office with her husband's recent visit.    Past Medical History:  Diagnosis Date  Adult-onset obesity    Allergic rhinitis    Atherosclerosis of native coronary artery of native heart without angina pectoris    BMI 32.0-32.9,adult    Dyslipidemia    Dysphagia    Fatigue 02/18/2022   GERD without esophagitis    Hypercholesterolemia 11/11/2021   Hypertension, essential    Hypertensive disorder 11/11/2021   Postmenopausal    Postoperative examination 01/25/2016   Vitamin D  deficiency     Past Surgical History:  Procedure Laterality Date   CHOLECYSTECTOMY  01/2016   PARATHYROIDECTOMY  2008   TONSILLECTOMY      Current Medications: Current Meds  Medication Sig   aspirin EC 81 MG tablet Take 81 mg by mouth daily.   atorvastatin  (LIPITOR) 20 MG tablet Take 1 tablet (20 mg total) by mouth daily.   benazepril (LOTENSIN) 20 MG tablet Take 20 mg by mouth 2 (two) times daily.   busPIRone (BUSPAR) 5 MG tablet Take 5 mg by mouth 2 (two) times daily.   Calcium  Carb-Cholecalciferol 500-3.125 MG-MCG TABS Take 1 tablet by mouth daily.   Cholecalciferol 25 MCG (1000 UT) tablet Take 2,000 Units by mouth daily.   dicyclomine (BENTYL) 10 MG capsule as needed for spasms.   ezetimibe (ZETIA) 10 MG tablet Take 10 mg by mouth daily.   fluticasone (FLONASE) 50 MCG/ACT nasal spray Place 1 spray into both nostrils as needed for allergies or rhinitis.   loratadine (CLARITIN) 10 MG tablet Take 1 tablet by mouth daily.   Multiple Vitamin (MULTIVITAMIN PO) Take 1 Pools by mouth daily.   omeprazole (PRILOSEC) 20 MG capsule Take 20 mg by mouth daily.   Probiotic Product (PROBIOTIC PO) Take 1 tablet by mouth daily.   psyllium (METAMUCIL) 58.6 % packet Take 1 packet by mouth daily.     Allergies:   Sulfamethoxazole   Social History   Socioeconomic History   Marital status: Married    Spouse name: Not on file   Number of children: Not on file   Years of education: Not on file   Highest education level: Not on file  Occupational History   Not on file  Tobacco Use   Smoking status: Never   Smokeless tobacco: Never  Substance and Sexual Activity   Alcohol use: Never   Drug use: Never   Sexual activity: Not on file  Other Topics Concern   Not on file  Social History Narrative   Not on file   Social Drivers of Health   Financial Resource Strain: Not on file  Food Insecurity: Not on file  Transportation Needs: Not on file  Physical Activity: Not on file  Stress: Not  on file  Social Connections: Not on file     Family History: The patient's family history includes Arthritis in her father and mother; Colon cancer in her maternal grandmother; Diabetes in her mother; Emphysema in her mother; Heart disease in her father; Hyperlipidemia in her mother; Hypertension in her mother; Lung cancer in her father. ROS:   Please see the history of present illness.    All 14 point review of systems negative except as described per history of present illness.  EKGs/Labs/Other Studies Reviewed:    The following studies were reviewed today:   EKG:       Recent Labs: No results found for requested labs within last 365 days.  Recent Lipid Panel    Component Value Date/Time   CHOL 137 04/08/2023 0829   TRIG 79 04/08/2023 0829   HDL 60  04/08/2023 0829   CHOLHDL 2.3 04/08/2023 0829   LDLCALC 62 04/08/2023 0829    Physical Exam:    VS:  BP (!) 149/85 (BP Location: Right Arm)   Pulse 77   Ht 4' 11 (1.499 m)   Wt 159 lb (72.1 kg)   SpO2 99%   BMI 32.11 kg/m     Wt Readings from Last 3 Encounters:  11/27/23 159 lb (72.1 kg)  11/26/23 157 lb (71.2 kg)  04/07/23 155 lb (70.3 kg)     GENERAL:  Well nourished, well developed in no acute distress NECK: No JVD; No carotid bruits CARDIAC: RRR, S1 and S2 present, no murmurs, no rubs, no gallops CHEST:  Clear to auscultation without rales, wheezing or rhonchi  Extremities: No pitting pedal edema. Pulses bilaterally symmetric with radial 2+ and dorsalis pedis 2+ NEUROLOGIC:  Alert and oriented x 3  Medication Adjustments/Labs and Tests Ordered: Current medicines are reviewed at length with the patient today.  Concerns regarding medicines are outlined above.  Orders Placed This Encounter  Procedures   CT Chest Wo Contrast   No orders of the defined types were placed in this encounter.   Signed, Alean jess Kobus, MD, MPH, [facility]. 11/27/2023 11:46 AM    [facility]

## 2023-11-27 NOTE — Assessment & Plan Note (Signed)
 CAD cardiac CT November 2023 identified lung nodule 4 mm.  Given her history of smoking and family history of lung cancer, follow-up imaging study I do not see anything available. Will order CT chest without contrast

## 2023-11-27 NOTE — Patient Instructions (Signed)
 Medication Instructions:  Your physician recommends that you continue on your current medications as directed. Please refer to the Current Medication list given to you today.  *If you need a refill on your cardiac medications before your next appointment, please call your pharmacy*  Lab Work: None If you have labs (blood work) drawn today and your tests are completely normal, you will receive your results only by: MyChart Message (if you have MyChart) OR A paper copy in the mail If you have any lab test that is abnormal or we need to change your treatment, we will call you to review the results.  Testing/Procedures: CT-scan of the chest   Follow-Up: At [facility], you and your health needs are our priority.  As part of our continuing mission to provide you with exceptional heart care, our providers are all part of one team.  This team includes your primary Cardiologist (physician) and Advanced Practice Providers or APPs (Physician Assistants and Nurse Practitioners) who all work together to provide you with the care you need, when you need it.  Your next appointment:   6 month(s)  Provider:   Alean Kobus, MD    We recommend signing up for the patient portal called MyChart.  Sign up information is provided on this After Visit Summary.  MyChart is used to connect with patients for Virtual Visits (Telemedicine).  Patients are able to view lab/test results, encounter notes, upcoming appointments, etc.  Non-urgent messages can be sent to your provider as well.   To learn more about what you can do with MyChart, go to [email or web address].   Other Instructions None

## 2023-11-27 NOTE — Assessment & Plan Note (Signed)
 Elevated readings here in the office but mentions home readings are better controlled. Continue with benazepril 20 mg twice daily. Continue monitoring at home until readings are consistently above 130/80 mmHg should inform us  so we can further titrate up her blood pressure medications.

## 2023-11-27 NOTE — Assessment & Plan Note (Signed)
 Lipid panel November 03, 2023 noted well-controlled levels. Continue atorvastatin  20 mg once daily and Zetia 10 mg once daily. She is diligent with her diet modifications and regular exercise. Congratulated her on improved lipid levels.

## 2023-12-04 ENCOUNTER — Ambulatory Visit (HOSPITAL_BASED_OUTPATIENT_CLINIC_OR_DEPARTMENT_OTHER)
Admission: RE | Admit: 2023-12-04 | Discharge: 2023-12-04 | Disposition: A | Discharge status: Home / Self Care | Source: Ambulatory Visit

## 2023-12-04 DIAGNOSIS — R918 Other nonspecific abnormal finding of lung field: Secondary | ICD-10-CM

## 2023-12-09 ENCOUNTER — Encounter (INDEPENDENT_AMBULATORY_CARE_PROVIDER_SITE_OTHER): Payer: Self-pay | Admitting: Adult Health

## 2023-12-09 ENCOUNTER — Ambulatory Visit (INDEPENDENT_AMBULATORY_CARE_PROVIDER_SITE_OTHER): Admitting: Adult Health

## 2023-12-09 VITALS — BP 155/75 | HR 72 | Temp 98.6°F | Ht 59.0 in | Wt 155.0 lb

## 2023-12-09 DIAGNOSIS — I10 Essential (primary) hypertension: Secondary | ICD-10-CM | POA: Diagnosis not present

## 2023-12-09 DIAGNOSIS — E7800 Pure hypercholesterolemia, unspecified: Secondary | ICD-10-CM

## 2023-12-09 DIAGNOSIS — Z0000 Encounter for general adult medical examination without abnormal findings: Secondary | ICD-10-CM

## 2023-12-09 DIAGNOSIS — Z0289 Encounter for other administrative examinations: Secondary | ICD-10-CM

## 2023-12-09 DIAGNOSIS — E669 Obesity, unspecified: Secondary | ICD-10-CM

## 2023-12-09 DIAGNOSIS — E559 Vitamin D deficiency, unspecified: Secondary | ICD-10-CM | POA: Diagnosis not present

## 2023-12-09 DIAGNOSIS — Z6831 Body mass index (BMI) 31.0-31.9, adult: Secondary | ICD-10-CM

## 2023-12-09 DIAGNOSIS — I2510 Atherosclerotic heart disease of native coronary artery without angina pectoris: Secondary | ICD-10-CM | POA: Diagnosis not present

## 2023-12-09 NOTE — Progress Notes (Addendum)
 Office: [phone]  /  Fax: [phone]   Initial Visit    Victoria Torres was seen in clinic today to evaluate for obesity. She is interested in losing weight to improve overall health and reduce the risk of weight related complications. She presents today to review program treatment options, initial physical assessment, and evaluation.     She was referred by: PCP  When asked what else they would like to accomplish? She states: Adopt a healthier eating pattern and lifestyle, Improve energy levels and physical activity, Improve existing medical conditions, and Improve quality of life  When asked how has your weight affected you? She states: Contributed to medical problems and Contributed to orthopedic problems or mobility issues  Weight history: Weight has been a concern since high school.  Weight in high school was 150s- today's weight 155 lbs  Highest weight: 160 lbs  Some associated conditions: Hypertension, Hyperlipidemia, and Vitamin D Deficiency  Contributing factors: family history of obesity, use of obesogenic medications: Psychotropic medications, and moderate to high levels of stress  Weight promoting medications identified: Psychotropic medications  Prior weight loss attempts: Weight Watchers and Low Carb  Current nutrition plan: High-protein and Other: Limiting sugar/simple CHO/saturated fat  Current level of physical activity: Other: Elliptical at gym , 3-4 times per week  Current or previous pharmacotherapy: None  Response to medication: Never tried medications   Past medical history includes:   Past Medical History:  Diagnosis Date   Adult-onset obesity    Allergic rhinitis    Atherosclerosis of native coronary artery of native heart without angina pectoris    BMI 32.0-32.9,adult    Dyslipidemia    Dysphagia    Fatigue 02/18/2022   GERD without esophagitis    Hypercholesterolemia 11/11/2021   Hypertension, essential    Hypertensive disorder  11/11/2021   Postmenopausal    Postoperative examination 01/25/2016   Vitamin D deficiency      Objective    BP (!) 155/75   Pulse 72   Temp 98.6 F (37 C)   Ht 4' 11 (1.499 m)   Wt 155 lb (70.3 kg)   LMP  (LMP Unknown)   SpO2 99%   BMI 31.31 kg/m  She was weighed on the bioimpedance scale: Body mass index is 31.31 kg/m.  Body Fat%:42.6, Visceral Fat Rating:12, Weight trend over the last 12 months: Decreasing  General:  Alert, oriented and cooperative. Patient is in no acute distress.  Respiratory: Normal respiratory effort, no problems with respiration noted   Gait: able to ambulate independently  Mental Status: Normal mood and affect. Normal behavior. Normal judgment and thought content.   DIAGNOSTIC DATA REVIEWED:  BMET    Component Value Date/Time   NA 139 03/31/2022 0903   K 4.4 03/31/2022 0903   CL 101 03/31/2022 0903   CO2 27 03/31/2022 0903   GLUCOSE 90 03/31/2022 0903   GLUCOSE 98 10/13/2006 1400   BUN 20 03/31/2022 0903   CREATININE 0.73 03/31/2022 0903   CALCIUM  9.3 03/31/2022 0903   GFRNONAA >60 10/13/2006 1400   GFRAA  10/13/2006 1400    >60        The eGFR has been calculated using the MDRD equation. This calculation has not been validated in all clinical   No results found for: HGBA1C No results found for: INSULIN CBC    Component Value Date/Time   WBC 9.5 10/13/2006 1400   RBC 5.28 (H) 10/13/2006 1400   HGB 15.1 (H) 10/13/2006 1400   HCT  44.7 10/13/2006 1400   PLT 385 10/13/2006 1400   MCV 84.5 10/13/2006 1400   MCHC 33.8 10/13/2006 1400   RDW 13.2 10/13/2006 1400   Iron/TIBC/Ferritin/ %Sat No results found for: IRON, TIBC, FERRITIN, IRONPCTSAT Lipid Panel     Component Value Date/Time   CHOL 137 04/08/2023 0829   TRIG 79 04/08/2023 0829   HDL 60 04/08/2023 0829   CHOLHDL 2.3 04/08/2023 0829   LDLCALC 62 04/08/2023 0829   Hepatic Function Panel  No results found for: PROT, ALBUMIN, AST, ALT, ALKPHOS,  BILITOT, BILIDIR, IBILI No results found for: TSH   Assessment and Plan   Healthcare maintenance  Vitamin D deficiency  Atherosclerosis of native coronary artery of native heart without angina pectoris  Hypertension, essential  Hypercholesterolemia  Obesity (BMI 30-39.9), STARTING BMI 31.5   Assessment and Plan         ESTABLISH WITH [employer]   Obesity Treatment / Action Plan:  Patient will work on garnering support from family and friends to begin weight loss journey. Will work on eliminating or reducing the presence of highly palatable, calorie dense foods in the home. Will complete provided nutritional and psychosocial assessment questionnaire before the next appointment. Will be scheduled for indirect calorimetry to determine resting energy expenditure in a fasting state.  This will allow us  to create a reduced calorie, high-protein meal plan to promote loss of fat mass while preserving muscle mass. Counseled on the health benefits of losing 5%-15% of total body weight. Was counseled on nutritional approaches to weight loss and benefits of reducing processed foods and consuming plant-based foods and high quality protein as part of nutritional weight management. Was counseled on pharmacotherapy and role as an adjunct in weight management.   Obesity Education Performed Today:  She was weighed on the bioimpedance scale and results were discussed and documented in the synopsis.  We discussed obesity as a disease and the importance of a more detailed evaluation of all the factors contributing to the disease.  We discussed the importance of long term lifestyle changes which include nutrition, exercise and behavioral modifications as well as the importance of customizing this to her specific health and social needs.  We discussed the benefits of reaching a healthier weight to alleviate the symptoms of existing conditions and reduce the risks of the biomechanical, metabolic  and psychological effects of obesity.  We reviewed the four pillars of obesity medicine and importance of using a multimodal approach.  We reviewed the basic principles in weight management.   Victoria Torres appears to be in the action stage of change and states they are ready to start intensive lifestyle modifications and behavioral modifications.  I have spent 26 minutes in the care of the patient today including: 3 minutes before the visit reviewing and preparing the chart. 20 minutes face-to-face assessing and reviewing listed medical problems as outlined in obesity care plan, providing nutritional and behavioral counseling on topics outlined in the obesity care plan, counseling regarding anti-obesity medication as outlined in obesity care plan, independently interpreting test results and goals of care, as described in assessment and plan, and reviewing and discussing biometric information and progress 3 minutes after the visit updating chart and documentation of encounter.  Reviewed by clinician on day of visit: allergies, medications, problem list, medical history, surgical history, family history, social history, and previous encounter notes pertinent to obesity diagnosis.  Sharyon Peitz d. Audrionna Lampton, NP-c

## 2023-12-15 ENCOUNTER — Ambulatory Visit: Payer: Self-pay

## 2023-12-18 ENCOUNTER — Ambulatory Visit: Admitting: Cardiology

## 2023-12-22 ENCOUNTER — Institutional Professional Consult (permissible substitution) (INDEPENDENT_AMBULATORY_CARE_PROVIDER_SITE_OTHER): Admitting: Family Medicine

## 2024-02-10 ENCOUNTER — Ambulatory Visit (INDEPENDENT_AMBULATORY_CARE_PROVIDER_SITE_OTHER): Admitting: Family Medicine

## 2024-02-10 ENCOUNTER — Encounter (INDEPENDENT_AMBULATORY_CARE_PROVIDER_SITE_OTHER): Payer: Self-pay | Admitting: Family Medicine

## 2024-02-10 VITALS — BP 138/86 | HR 68 | Temp 97.6°F | Ht 59.0 in | Wt 153.0 lb

## 2024-02-10 DIAGNOSIS — R5383 Other fatigue: Secondary | ICD-10-CM

## 2024-02-10 DIAGNOSIS — I10 Essential (primary) hypertension: Secondary | ICD-10-CM | POA: Diagnosis not present

## 2024-02-10 DIAGNOSIS — E785 Hyperlipidemia, unspecified: Secondary | ICD-10-CM

## 2024-02-10 DIAGNOSIS — E669 Obesity, unspecified: Secondary | ICD-10-CM | POA: Diagnosis not present

## 2024-02-10 DIAGNOSIS — E7800 Pure hypercholesterolemia, unspecified: Secondary | ICD-10-CM

## 2024-02-10 DIAGNOSIS — R0602 Shortness of breath: Secondary | ICD-10-CM | POA: Insufficient documentation

## 2024-02-10 DIAGNOSIS — E559 Vitamin D deficiency, unspecified: Secondary | ICD-10-CM | POA: Diagnosis not present

## 2024-02-10 DIAGNOSIS — Z6830 Body mass index (BMI) 30.0-30.9, adult: Secondary | ICD-10-CM

## 2024-02-10 DIAGNOSIS — E66811 Obesity, class 1: Secondary | ICD-10-CM

## 2024-02-10 DIAGNOSIS — Z1331 Encounter for screening for depression: Secondary | ICD-10-CM

## 2024-02-10 NOTE — Progress Notes (Signed)
 Office: [phone]  /  Fax: [phone]  WEIGHT SUMMARY AND BIOMETRICS  Anthropometric Measurements Height: 4' 11 (1.499 m) Weight: 153 lb (69.4 kg) BMI (Calculated): 30.89 Weight at Last Visit: N/a Weight Lost Since Last Visit: N/a Weight Gained Since Last Visit: N/a Total Weight Loss (lbs): 153 lb (69.4 kg) Peak Weight: 209 lb Waist Measurement : 37.5 inches   Body Composition  Body Fat %: 41.1 % Fat Mass (lbs): 63.2 lbs Muscle Mass (lbs): 85.8 lbs Total Body Water (lbs): 60.6 lbs Visceral Fat Rating : 12   Other Clinical Data RMR: 1109 Fasting: Yes Labs: yes Today's Visit #: 1 Starting Date: 02/10/24    Chief Complaint: OBESITY    History of Present Illness Victoria Torres is a 73 year old female who presents for a workup to assess the most effective treatment option for weight management.  She has a history of weight gain, with her heaviest weight being 209 pounds, and she is currently at 150 pounds. Despite efforts to decrease saturated fats, increase protein, and consume low simple carbohydrates, she has not been able to lose further weight. She exercises five to six days a week, initially aiming for 150 minutes per week, and has increased to 45 minutes per session. She experiences stress and social eating issues, particularly with family, and has a history of emotional eating and feelings of guilt related to her food choices.  She has hypertension, controlled with medication, and hyperlipidemia, for which she takes atorvastatin  20 mg. She is working on an eating plan to help manage these conditions. She also has a vitamin D deficiency and takes over-the-counter vitamin D, 2000 IU twice a day.  She reports fatigue, which may or may not be related to her weight, and dyspnea on exertion, which has worsened with weight gain.  She has a history of cataract surgery in April, which temporarily interrupted her exercise routine, but she has since resumed her  regimen.  Testing today includes:  Basal Metabolic Rate calculated via Bioimpedance scale 1255 Resting Energy Expenditure measured via Indirect Calorimeter 1109 and is lower  than expected. PHQ-9 score was WNL at 0 Epworth Sleepiness Score was WNL at 3 ECG NSR @ 64 BPM      PHYSICAL EXAM:  Blood pressure 138/86, pulse 68, temperature 97.6 F (36.4 C), height 4' 11 (1.499 m), weight 153 lb (69.4 kg), SpO2 97%. Body mass index is 30.9 kg/m.  DIAGNOSTIC DATA REVIEWED:  BMET    Component Value Date/Time   NA 139 03/31/2022 0903   K 4.4 03/31/2022 0903   CL 101 03/31/2022 0903   CO2 27 03/31/2022 0903   GLUCOSE 90 03/31/2022 0903   GLUCOSE 98 10/13/2006 1400   BUN 20 03/31/2022 0903   CREATININE 0.73 03/31/2022 0903   CALCIUM  9.3 03/31/2022 0903   GFRNONAA >60 10/13/2006 1400   GFRAA  10/13/2006 1400    >60        The eGFR has been calculated using the MDRD equation. This calculation has not been validated in all clinical   No results found for: HGBA1C No results found for: INSULIN No results found for: TSH CBC    Component Value Date/Time   WBC 9.5 10/13/2006 1400   RBC 5.28 (H) 10/13/2006 1400   HGB 15.1 (H) 10/13/2006 1400   HCT 44.7 10/13/2006 1400   PLT 385 10/13/2006 1400   MCV 84.5 10/13/2006 1400   MCHC 33.8 10/13/2006 1400   RDW 13.2 10/13/2006 1400   Iron  Studies No results found for: IRON, TIBC, FERRITIN, IRONPCTSAT Lipid Panel     Component Value Date/Time   CHOL 137 04/08/2023 0829   TRIG 79 04/08/2023 0829   HDL 60 04/08/2023 0829   CHOLHDL 2.3 04/08/2023 0829   LDLCALC 62 04/08/2023 0829   Hepatic Function Panel  No results found for: PROT, ALBUMIN, AST, ALT, ALKPHOS, BILITOT, BILIDIR, IBILI No results found for: TSH Nutritional No results found for: VD25OH   Assessment and Plan Assessment & Plan Obesity Obesity with a current weight of 150 lbs, previously 209 lbs. Despite significant weight  loss, further reduction has plateaued. Exercise regimen includes 150 minutes per week, recently increased to 45 minutes per session. Dietary habits include reduced saturated fats, increased protein, and low simple carbohydrates. Emotional and social eating challenges present. Metabolic rate assessed at 1100 calories, lower than expected 1250 calories. Body composition shows fat percentage slightly above desired level and visceral fat at the upper limit of normal. Muscle mass needs to be maintained during weight loss. - Order labs to assess current metabolic and nutritional status - Provide two dietary plan options: a structured  Category 1 eating plan or a self-monitored journaling approach with specific calorie and protein goals - Advise maintaining calorie intake between [phone] calories per day - Recommend protein intake of at least 70 grams per day, primarily from real food versus protein supplements - Discuss potential use of journaling apps like My Fitness Pal or Lose It for dietary tracking - Schedule follow-up in two weeks to review progress and lab results  Essential hypertension Hypertension well-controlled with medication. Blood pressure today is 138/86 mmHg. - Order labs to monitor hypertension status - Continue current antihypertensive medication regimen - Continue diet, exercise and weight loss as discussed today as an important part of the treatment plan   Hyperlipidemia Hyperlipidemia managed with atorvastatin  20 mg. Recent cholesterol levels satisfactory after initial lab error. Dietary adjustments include reduced saturated fat intake. - Order labs to monitor lipid levels - Continue atorvastatin  20 mg - Advise on dietary fat intake, emphasizing low saturated fat, and small amounts of healthy fats  Vitamin D deficiency Vitamin D deficiency managed with over-the-counter vitamin D supplementation at 2000 IU twice daily. - Order labs to monitor vitamin D levels - Continue  vitamin D supplementation at current dosage  Fatigue Fatigue potentially multifactorial and not assumed to be solely weight-related. Further evaluation planned. - Evaluate potential causes if fatigue persists - Labs done today, will follow up in 2 weeks to discuss results  Exertional dyspnea Exertional dyspnea worsened with weight gain. Possible exercise intolerance or respiratory issue. Monitoring planned to assess improvement with weight loss. - Consider further respiratory evaluation if symptoms do not improve with weight loss  Follow-Up Follow-up appointments scheduled to monitor progress and adjust treatment plans as necessary. - Schedule follow-up appointment in two weeks to review lab results and progress - Plan subsequent appointment with Katie two weeks after the next follow-up     I personally spent a total of 46 minutes in the care of the patient today including preparing to see the patient, reviewing separately obtained history, performing a medically appropriate evaluation of current problems, placing orders in the EMR, documenting clinical information in the EMR, customized nutritional counseling for their specific health and social needs, independently interpreting results, and explaining the pathophysiology of obesity and how it is significantly more complex than eat less and exercise more.    Milania was informed of the importance of frequent follow  up visits to maximize her success with intensive lifestyle modifications for her obesity and obesity related health conditions as recommended by USPSTF and CMS guidelines   Louann Penton, MD

## 2024-02-11 LAB — TSH: TSH: 1.9 u[IU]/mL (ref 0.450–4.500)

## 2024-02-11 LAB — CMP14+EGFR
ALT: 18 IU/L (ref 0–32)
AST: 21 IU/L (ref 0–40)
Albumin: 4.5 g/dL (ref 3.9–4.9)
Alkaline Phosphatase: 70 IU/L (ref 49–135)
BUN/Creatinine Ratio: 24 (ref 12–28)
BUN: 17 mg/dL (ref 8–27)
Bilirubin Total: 0.5 mg/dL (ref 0.0–1.2)
CO2: 24 mmol/L (ref 20–29)
Calcium: 9.5 mg/dL (ref 8.7–10.3)
Chloride: 101 mmol/L (ref 96–106)
Creatinine, Ser: 0.7 mg/dL (ref 0.57–1.00)
Globulin, Total: 2.5 g/dL (ref 1.5–4.5)
Glucose: 95 mg/dL (ref 70–99)
Potassium: 4.1 mmol/L (ref 3.5–5.2)
Sodium: 141 mmol/L (ref 134–144)
Total Protein: 7 g/dL (ref 6.0–8.5)
eGFR: 93 mL/min/1.73 (ref >59)

## 2024-02-11 LAB — LIPID PANEL WITH LDL/HDL RATIO
Cholesterol, Total: 145 mg/dL (ref 100–199)
HDL: 69 mg/dL (ref >39)
LDL Chol Calc (NIH): 58 mg/dL (ref 0–99)
LDL/HDL Ratio: 0.8 ratio (ref 0.0–3.2)
Triglycerides: 96 mg/dL (ref 0–149)
VLDL Cholesterol Cal: 18 mg/dL (ref 5–40)

## 2024-02-11 LAB — INSULIN, RANDOM: INSULIN: 13.5 u[IU]/mL (ref 2.6–24.9)

## 2024-02-11 LAB — CBC WITH DIFFERENTIAL/PLATELET
Basophils Absolute: 0.1 x10E3/uL (ref 0.0–0.2)
Basos: 1 %
EOS (ABSOLUTE): 0.2 x10E3/uL (ref 0.0–0.4)
Eos: 2 %
Hematocrit: 45.1 % (ref 34.0–46.6)
Hemoglobin: 14.4 g/dL (ref 11.1–15.9)
Immature Grans (Abs): 0 x10E3/uL (ref 0.0–0.1)
Immature Granulocytes: 0 %
Lymphocytes Absolute: 2.7 x10E3/uL (ref 0.7–3.1)
Lymphs: 29 %
MCH: 28.7 pg (ref 26.6–33.0)
MCHC: 31.9 g/dL (ref 31.5–35.7)
MCV: 90 fL (ref 79–97)
Monocytes Absolute: 0.6 x10E3/uL (ref 0.1–0.9)
Monocytes: 7 %
Neutrophils Absolute: 5.9 x10E3/uL (ref 1.4–7.0)
Neutrophils: 61 %
Platelets: 300 x10E3/uL (ref 150–450)
RBC: 5.02 x10E6/uL (ref 3.77–5.28)
RDW: 12.9 % (ref 11.7–15.4)
WBC: 9.5 x10E3/uL (ref 3.4–10.8)

## 2024-02-11 LAB — VITAMIN D 25 HYDROXY (VIT D DEFICIENCY, FRACTURES): Vit D, 25-Hydroxy: 73.1 ng/mL (ref 30.0–100.0)

## 2024-02-11 LAB — VITAMIN B12: Vitamin B-12: 447 pg/mL (ref 232–1245)

## 2024-02-11 LAB — T3: T3, Total: 159 ng/dL (ref 71–180)

## 2024-02-11 LAB — HEMOGLOBIN A1C
Est. average glucose Bld gHb Est-mCnc: 123 mg/dL
Hgb A1c MFr Bld: 5.9 % — ABNORMAL HIGH (ref 4.8–5.6)

## 2024-02-11 LAB — PHOSPHORUS: Phosphorus: 4.1 mg/dL (ref 3.0–4.3)

## 2024-02-11 LAB — FOLATE: Folate: 12.7 ng/mL (ref >3.0)

## 2024-02-11 LAB — T4, FREE: Free T4: 1.33 ng/dL (ref 0.82–1.77)

## 2024-02-24 ENCOUNTER — Ambulatory Visit (INDEPENDENT_AMBULATORY_CARE_PROVIDER_SITE_OTHER): Admitting: Family Medicine

## 2024-02-24 ENCOUNTER — Encounter (INDEPENDENT_AMBULATORY_CARE_PROVIDER_SITE_OTHER): Payer: Self-pay | Admitting: Family Medicine

## 2024-02-24 VITALS — BP 119/76 | HR 75 | Temp 97.8°F | Ht 59.0 in | Wt 151.0 lb

## 2024-02-24 DIAGNOSIS — E785 Hyperlipidemia, unspecified: Secondary | ICD-10-CM

## 2024-02-24 DIAGNOSIS — E669 Obesity, unspecified: Secondary | ICD-10-CM

## 2024-02-24 DIAGNOSIS — Z6830 Body mass index (BMI) 30.0-30.9, adult: Secondary | ICD-10-CM

## 2024-02-24 DIAGNOSIS — R7303 Prediabetes: Secondary | ICD-10-CM | POA: Diagnosis not present

## 2024-02-24 DIAGNOSIS — E559 Vitamin D deficiency, unspecified: Secondary | ICD-10-CM

## 2024-02-24 DIAGNOSIS — E66811 Obesity, class 1: Secondary | ICD-10-CM

## 2024-02-24 DIAGNOSIS — E538 Deficiency of other specified B group vitamins: Secondary | ICD-10-CM

## 2024-02-24 DIAGNOSIS — E7800 Pure hypercholesterolemia, unspecified: Secondary | ICD-10-CM

## 2024-02-24 NOTE — Progress Notes (Signed)
 Office: [phone]  /  Fax: [phone]  WEIGHT SUMMARY AND BIOMETRICS  Anthropometric Measurements Height: 4' 11 (1.499 m) Weight: 151 lb (68.5 kg) BMI (Calculated): 30.48 Weight at Last Visit: 153 lb Weight Lost Since Last Visit: 2 lb Weight Gained Since Last Visit: 0 Starting Weight: 153 lb Total Weight Loss (lbs): 2 lb (0.907 kg) Peak Weight: 209 lb Waist Measurement : 37.5 inches   Body Composition  Body Fat %: 41 % Fat Mass (lbs): 62 lbs Muscle Mass (lbs): 84.4 lbs Total Body Water (lbs): 61.4 lbs Visceral Fat Rating : 12   Other Clinical Data RMR: 1109 Fasting: no Labs: no Today's Visit #: 2 Starting Date: 02/10/24    Chief Complaint: OBESITY    History of Present Illness Victoria Torres is a 73 year old female with obesity who presents for a follow-up on her weight management plan.  She adheres to a category one eating plan with 98% compliance, resulting in a weight loss of two pounds over the past two weeks. She has not yet started an exercise regimen as it has not been assigned. She was encouraged to keep a food journal to look at her macronutrient rations which she did very well with.  She is experimenting with her diet, comparing protein content between sources like quinoa, chickpeas, and chicken breast. She avoids sausage, bacon, and cheese due to cholesterol concerns but occasionally consumes cheese for satisfaction. She makes parfaits with fruits such as strawberries, blueberries, and apples, and has tried adding walnuts, though nuts are not well tolerated by her digestive system. She is mindful of her calorie intake, often questioning the worth of higher calorie, low protein foods like avocado and nuts. She has discovered high-fiber spinach tortillas and uses cottage cheese and [place] pepperoni as low-calorie, high-protein options. She is cautious about sodium intake. She has eliminated added sugar from her coffee, substituting it with unsweetened  almond milk and black coffee.  She experiences sleep disturbances, which she attributes to a low-carb diet. To address this, she consumes wasa bread with Laughing Cow cheese before bed. She is exploring recipes to meet her nutritional goals, including using plain yogurt instead of flavored varieties to reduce sugar intake.  She engages in physical activity, primarily using an elliptical machine for about 200-300 minutes per week. She enjoys walking outdoors but is limited by weather conditions. She has a history of a Baker's cyst, which is managed well with regular exercise.  She is concerned about her B12 levels, which are mildly below ideal, and is considering a multivitamin to address this. She has avoided B12 supplements in the past due to adverse effects.  She has a family history of hair thinning and is considering options to address her own hair concerns.      PHYSICAL EXAM:  Blood pressure 119/76, pulse 75, temperature 97.8 F (36.6 C), height 4' 11 (1.499 m), weight 151 lb (68.5 kg), SpO2 97%. Body mass index is 30.5 kg/m.  DIAGNOSTIC DATA REVIEWED:  BMET    Component Value Date/Time   NA 141 02/10/2024 1010   K 4.1 02/10/2024 1010   CL 101 02/10/2024 1010   CO2 24 02/10/2024 1010   GLUCOSE 95 02/10/2024 1010   GLUCOSE 98 10/13/2006 1400   BUN 17 02/10/2024 1010   CREATININE 0.70 02/10/2024 1010   CALCIUM  9.5 02/10/2024 1010   GFRNONAA >60 10/13/2006 1400   GFRAA  10/13/2006 1400    >60        The eGFR  has been calculated using the MDRD equation. This calculation has not been validated in all clinical   Lab Results  Component Value Date   HGBA1C 5.9 (H) 02/10/2024   Lab Results  Component Value Date   INSULIN 13.5 02/10/2024   Lab Results  Component Value Date   TSH 1.900 02/10/2024   CBC    Component Value Date/Time   WBC 9.5 02/10/2024 1010   WBC 9.5 10/13/2006 1400   RBC 5.02 02/10/2024 1010   RBC 5.28 (H) 10/13/2006 1400   HGB 14.4 02/10/2024  1010   HCT 45.1 02/10/2024 1010   PLT 300 02/10/2024 1010   MCV 90 02/10/2024 1010   MCH 28.7 02/10/2024 1010   MCHC 31.9 02/10/2024 1010   MCHC 33.8 10/13/2006 1400   RDW 12.9 02/10/2024 1010   Iron Studies No results found for: IRON, TIBC, FERRITIN, IRONPCTSAT Lipid Panel     Component Value Date/Time   CHOL 145 02/10/2024 1010   TRIG 96 02/10/2024 1010   HDL 69 02/10/2024 1010   CHOLHDL 2.3 04/08/2023 0829   LDLCALC 58 02/10/2024 1010   Hepatic Function Panel     Component Value Date/Time   PROT 7.0 02/10/2024 1010   ALBUMIN 4.5 02/10/2024 1010   AST 21 02/10/2024 1010   ALT 18 02/10/2024 1010   ALKPHOS 70 02/10/2024 1010   BILITOT 0.5 02/10/2024 1010      Component Value Date/Time   TSH 1.900 02/10/2024 1010   Nutritional Lab Results  Component Value Date   VD25OH 73.1 02/10/2024     Assessment and Plan Assessment & Plan Obesity with insulin resistance and prediabetes Obesity with associated insulin resistance and prediabetes. A1c is 5.9, indicating prediabetes. Insulin level is elevated at 13.5, suggesting increased pancreatic workload. Current dietary changes have resulted in a 2-pound weight loss over two weeks, with a favorable fat-to-water loss ratio. She is actively engaged in dietary modifications and exercise, which are expected to improve insulin sensitivity and weight management. She is aware that elevated insulin can contribute to weight gain despite caloric control. - Continue category one eating plan with focus on reducing simple carbohydrates and increasing lean proteins. - Encourage continuation of current exercise regimen, aiming for 200-300 minutes per week. - Provide handout on insulin resistance for further education. - Recheck A1c and insulin levels in three months.  Hyperlipidemia Hyperlipidemia is well-controlled with current medications. Recent cholesterol levels are favorable, with LDL at 58, which is below the target of 70. She  is on Zetia and Lipitor, and dietary changes are contributing positively to lipid management. - Continue Zetia and Lipitor. - Continue dietary modifications to support lipid management.  Vitamin D deficiency- controlled Vitamin D levels are currently at 77, which is slightly above the ideal range of 50-60. There is a risk of over-replacement as weight loss progresses, which can increase vitamin D levels. - Continue current vitamin D supplementation. - Recheck vitamin D levels in three months to monitor for over-replacement.  Vitamin B12 level slightly low-normal Vitamin B12 level is slightly below the ideal at 447. She is not consuming a large amount of meat, which is a primary source of B12. She has experienced adverse effects from B12 supplements in the past. - Recommend One A Day 50+ Women's multivitamin to support B12 levels. - Recheck B12 levels in three months.     I personally spent a total of 42 minutes in the care of the patient today including preparing to see the patient, performing a  medically appropriate evaluation of current problems, placing orders in the EMR, documenting clinical information in the EMR, customized nutritional counseling for their specific health and social needs, independently interpreting results, discussing results with the patient and educating them on how these results can affect their health and weight, and explaining the pathophysiology of obesity and how it is significantly more complex than eat less and exercise more.    Kayti was informed of the importance of frequent follow up visits to maximize her success with intensive lifestyle modifications for her obesity and obesity related health conditions as recommended by USPSTF and CMS guidelines   Louann Penton, MD

## 2024-03-15 ENCOUNTER — Encounter (INDEPENDENT_AMBULATORY_CARE_PROVIDER_SITE_OTHER): Payer: Self-pay | Admitting: Adult Health

## 2024-03-15 ENCOUNTER — Ambulatory Visit (INDEPENDENT_AMBULATORY_CARE_PROVIDER_SITE_OTHER): Admitting: Adult Health

## 2024-03-15 VITALS — BP 138/82 | HR 79 | Temp 98.2°F | Ht 59.0 in | Wt 148.0 lb

## 2024-03-15 DIAGNOSIS — E559 Vitamin D deficiency, unspecified: Secondary | ICD-10-CM

## 2024-03-15 DIAGNOSIS — R7303 Prediabetes: Secondary | ICD-10-CM | POA: Diagnosis not present

## 2024-03-15 DIAGNOSIS — E7800 Pure hypercholesterolemia, unspecified: Secondary | ICD-10-CM | POA: Diagnosis not present

## 2024-03-15 DIAGNOSIS — E669 Obesity, unspecified: Secondary | ICD-10-CM

## 2024-03-15 DIAGNOSIS — Z6830 Body mass index (BMI) 30.0-30.9, adult: Secondary | ICD-10-CM

## 2024-03-15 NOTE — Progress Notes (Signed)
 WEIGHT SUMMARY AND BIOMETRICS  Vitals Temp: 98.2 F (36.8 C) BP: 138/82 Pulse Rate: 79 SpO2: 99 %   Anthropometric Measurements Height: 4' 11 (1.499 m) Weight: 148 lb (67.1 kg) BMI (Calculated): 29.88 Weight at Last Visit: 151lb Weight Lost Since Last Visit: 3lb Weight Gained Since Last Visit: 0lb Starting Weight: 153lb Total Weight Loss (lbs): 5 lb (2.268 kg) Peak Weight: 209lb Waist Measurement : 37.5 inches   Body Composition  Body Fat %: 40.3 % Fat Mass (lbs): 59.6 lbs Muscle Mass (lbs): 83.8 lbs Total Body Water (lbs): 60.4 lbs Visceral Fat Rating : 11   No data recorded  Chief Complaint:   OBESITY Victoria Torres is here to discuss her progress with her obesity treatment plan.  She is on the the Category 1 Plan and states she is following her eating plan approximately 99.5 % of the time.  She states she is exercising Walking/Elliptical 45 minutes 6 times per week.  Interim History:   03/15/24 10:00  Visceral Fat Rating  11    03/15/24 10:00  Weight 148 lb (67.1 kg)  BMI (Calculated) 29.88  Total Weight Loss (lbs) 5 lb (2.268 kg)   Hunger/appetite-stable appetite on Cat 1 MP  Hydration-she estimates to drink 40 oz water/day  Reviewed Bioimpedance Results with pt: Muscle Mass:-0.6 lb Adipose Mass: -2.4 lbs  Ultimate Goal Weight: 130-135 lbs  Subjective:   1. Vitamin D deficiency She was previously on daily OTC Vit D3 5000 She reduced to oral Vit D 3 2000   2. Prediabetes Lab Results  Component Value Date   HGBA1C 5.9 (H) 02/10/2024    Prediabetes controlled with lifestyle- she is not on any antidiabetic medication  3. Hypercholesterolemia Lipid Panel     Component Value Date/Time   CHOL 145 02/10/2024 1010   TRIG 96 02/10/2024 1010   HDL 69 02/10/2024 1010   CHOLHDL 2.3 04/08/2023 0829   LDLCALC 58 02/10/2024 1010   LABVLDL 18 02/10/2024 1010    Cards manages daily Lipitor 20mg   11/27/2023 Cards OV Note: ASSESSMENT AND PLAN:    Ms. Victoria Torres 73 year old woman with history of mild coronary artery disease [cardiac CT coronary angi[facility] at [facility] November 2023 CAD RADS 2 study with calcium  score 272, 4 mm right middle lobe pulmonary nodule], hypertension, hyperlipidemia former smoker [smoked for over 2 years about 50 years ago; has history of lung cancer in her father]. I do not see follow-up CT chest imaging study after her cardiac CT to follow-up on lung nodules. Here for follow-up visit doing well no cardiac symptoms.  Blood pressures at home well-controlled in comparison to office visits.  Assessment/Plan:   1. Vitamin D deficiency (Primary) Continue oral Vit D 3 2000   2. Prediabetes Continue healthy eating and regular walking  3. Hypercholesterolemia Continue healthy eating and regular walking  4. BMI 30.0-30.9,adult, CURRENT BMI 29.9  Victoria Torres is currently in the action stage of change. As such, her goal is to continue with weight loss efforts. She has agreed to the Category 1 Plan.   Exercise goals: Older adults should follow the adult guidelines. When older adults cannot meet the adult guidelines, they should be as physically active as their abilities and conditions will allow.  Older adults should do exercises that maintain or improve balance if they are at risk of falling.  Older adults should determine their level of effort for physical activity relative to their level of fitness.  Older adults with chronic conditions should understand  whether and how their conditions affect their ability to do regular physical activity safely.  Behavioral modification strategies: increasing lean protein intake, decreasing simple carbohydrates, increasing vegetables, increasing water intake, no skipping meals, meal planning and cooking strategies, keeping healthy foods in the home, ways to avoid boredom eating, and planning for success.  Victoria Torres has agreed to follow-up with our clinic in 3 weeks. She was  informed of the importance of frequent follow-up visits to maximize her success with intensive lifestyle modifications for her multiple health conditions.   Objective:   Blood pressure 138/82, pulse 79, temperature 98.2 F (36.8 C), height 4' 11 (1.499 m), weight 148 lb (67.1 kg), SpO2 99%. Body mass index is 29.89 kg/m.  General: Cooperative, alert, well developed, in no acute distress. HEENT: Conjunctivae and lids unremarkable. Cardiovascular: Regular rhythm.  Lungs: Normal work of breathing. Neurologic: No focal deficits.   Lab Results  Component Value Date   CREATININE 0.70 02/10/2024   BUN 17 02/10/2024   NA 141 02/10/2024   K 4.1 02/10/2024   CL 101 02/10/2024   CO2 24 02/10/2024   Lab Results  Component Value Date   ALT 18 02/10/2024   AST 21 02/10/2024   ALKPHOS 70 02/10/2024   BILITOT 0.5 02/10/2024   Lab Results  Component Value Date   HGBA1C 5.9 (H) 02/10/2024   Lab Results  Component Value Date   INSULIN 13.5 02/10/2024   Lab Results  Component Value Date   TSH 1.900 02/10/2024   Lab Results  Component Value Date   CHOL 145 02/10/2024   HDL 69 02/10/2024   LDLCALC 58 02/10/2024   TRIG 96 02/10/2024   CHOLHDL 2.3 04/08/2023   Lab Results  Component Value Date   VD25OH 73.1 02/10/2024   Lab Results  Component Value Date   WBC 9.5 02/10/2024   HGB 14.4 02/10/2024   HCT 45.1 02/10/2024   MCV 90 02/10/2024   PLT 300 02/10/2024   No results found for: IRON, TIBC, FERRITIN  Attestation Statements:   Reviewed by clinician on day of visit: allergies, medications, problem list, medical history, surgical history, family history, social history, and previous encounter notes.  Time spent on visit including pre-visit chart review and post-visit care and charting was 28 minutes.   I have reviewed the above documentation for accuracy and completeness, and I agree with the above. -  Isley Weisheit d. Bracen Schum, NP-C

## 2024-03-31 ENCOUNTER — Other Ambulatory Visit: Payer: Self-pay | Admitting: Cardiology

## 2024-04-12 ENCOUNTER — Ambulatory Visit (INDEPENDENT_AMBULATORY_CARE_PROVIDER_SITE_OTHER): Admitting: Adult Health

## 2024-04-12 ENCOUNTER — Encounter (INDEPENDENT_AMBULATORY_CARE_PROVIDER_SITE_OTHER): Payer: Self-pay | Admitting: Adult Health

## 2024-04-12 VITALS — BP 138/87 | HR 80 | Temp 98.2°F | Ht 59.0 in | Wt 143.0 lb

## 2024-04-12 DIAGNOSIS — I10 Essential (primary) hypertension: Secondary | ICD-10-CM | POA: Diagnosis not present

## 2024-04-12 DIAGNOSIS — E669 Obesity, unspecified: Secondary | ICD-10-CM | POA: Diagnosis not present

## 2024-04-12 DIAGNOSIS — R7303 Prediabetes: Secondary | ICD-10-CM

## 2024-04-12 DIAGNOSIS — E7800 Pure hypercholesterolemia, unspecified: Secondary | ICD-10-CM | POA: Diagnosis not present

## 2024-04-12 DIAGNOSIS — Z6830 Body mass index (BMI) 30.0-30.9, adult: Secondary | ICD-10-CM

## 2024-04-12 DIAGNOSIS — Z6829 Body mass index (BMI) 29.0-29.9, adult: Secondary | ICD-10-CM

## 2024-04-12 NOTE — Progress Notes (Signed)
 WEIGHT SUMMARY AND BIOMETRICS  Vitals Temp: 98.2 F (36.8 C) BP: 138/87 Pulse Rate: 80 SpO2: 99 %   Anthropometric Measurements Height: 4' 11 (1.499 m) Weight: 143 lb (64.9 kg) BMI (Calculated): 28.87 Weight at Last Visit: 148lb Weight Lost Since Last Visit: 5lb Weight Gained Since Last Visit: 0lb Starting Weight: 153lb Total Weight Loss (lbs): 10 lb (4.536 kg) Peak Weight: 209lb Waist Measurement : 37.5 inches   Body Composition  Body Fat %: 39.5 % Fat Mass (lbs): 56.8 lbs Muscle Mass (lbs): 82.4 lbs Total Body Water (lbs): 59.8 lbs Visceral Fat Rating : 11   Other Clinical Data RMR: 1109 Fasting: No Labs: No Today's Visit #: 3 Starting Date: 02/10/24    Chief Complaint:   OBESITY Victoria Torres is here to discuss her progress with her obesity treatment plan.  She is on the the Category 1 Plan and states she is following her eating plan approximately 98 % of the time.  She states she is exercising Walking 45 minutes 6 times per week.   Interim History:  When she walked for 60 mins she would experience bilateral leg muscle soreness. She has reduced walking from 60 mins to 45 mins and able to exercise without nighttime discomfort.  She is very pleased to have lost weight over the holiday season.  Current weight 143 lbs Goal weight 130-135 lbs  Non Scale Goals: To lower lipid levels To improve bone density- PCP will order routine DEXA scan  She reports utilizing 100 Cal Snack List- been helpful to choose healthier foods in evening.  Subjective:   1. Prediabetes Lab Results  Component Value Date   HGBA1C 5.9 (H) 02/10/2024    She is not currently on any antidiabetic medications She endorses stable appetite levels  2. Hypertension, essential Home readings: SBP 110s DBP 70s She is on  benazepril (LOTENSIN) 20 MG tablet  aspirin EC 81 MG tablet  ezetimibe (ZETIA) 10 MG tablet  atorvastatin  (LIPITOR) 20 MG tablet   3.  Hypercholesterolemia benazepril (LOTENSIN) 20 MG tablet  aspirin EC 81 MG tablet  ezetimibe (ZETIA) 10 MG tablet  atorvastatin  (LIPITOR) 20 MG tablet   She denies CP when walking briskly  04/08/2022 Coronary Calcium  Score 272- reviewed in EPIC  Assessment/Plan:   1. Prediabetes (Primary) Continue healthy eating and regular walking Check labs early 2026  2. Hypertension, essential Continue healthy eating and regular walking Check labs early 2026  3. Hypercholesterolemia Continue healthy eating and regular walking Check labs early 2026  4. BMI 30.0-30.9,adult, CURRENT BMI 29.0  Victoria Torres is currently in the action stage of change. As such, her goal is to continue with weight loss efforts. She has agreed to the Category 1 Plan.   Exercise goals: Older adults should follow the adult guidelines. When older adults cannot meet the adult guidelines, they should be as physically active as their abilities and conditions will allow.  Older adults should do exercises that maintain or improve balance if they are at risk of falling.  Older adults should determine their level of effort for physical activity relative to their level of fitness.  Older adults with chronic conditions should understand whether and how their conditions affect their ability to do regular physical activity safely.  Behavioral modification strategies: increasing lean protein intake, decreasing simple carbohydrates, increasing vegetables, increasing water intake, meal planning and cooking strategies, keeping healthy foods in the home, and planning for success.  Victoria Torres has agreed to follow-up with our clinic in 4 weeks.  She was informed of the importance of frequent follow-up visits to maximize her success with intensive lifestyle modifications for her multiple health conditions.   Check Fasting Labs early 2026  Objective:   Blood pressure 138/87, pulse 80, temperature 98.2 F (36.8 C), height 4' 11 (1.499 m), weight 143  lb (64.9 kg), SpO2 99%. Body mass index is 28.88 kg/m.  General: Cooperative, alert, well developed, in no acute distress. HEENT: Conjunctivae and lids unremarkable. Cardiovascular: Regular rhythm.  Lungs: Normal work of breathing. Neurologic: No focal deficits.   Lab Results  Component Value Date   CREATININE 0.70 02/10/2024   BUN 17 02/10/2024   NA 141 02/10/2024   K 4.1 02/10/2024   CL 101 02/10/2024   CO2 24 02/10/2024   Lab Results  Component Value Date   ALT 18 02/10/2024   AST 21 02/10/2024   ALKPHOS 70 02/10/2024   BILITOT 0.5 02/10/2024   Lab Results  Component Value Date   HGBA1C 5.9 (H) 02/10/2024   Lab Results  Component Value Date   INSULIN  13.5 02/10/2024   Lab Results  Component Value Date   TSH 1.900 02/10/2024   Lab Results  Component Value Date   CHOL 145 02/10/2024   HDL 69 02/10/2024   LDLCALC 58 02/10/2024   TRIG 96 02/10/2024   CHOLHDL 2.3 04/08/2023   Lab Results  Component Value Date   VD25OH 73.1 02/10/2024   Lab Results  Component Value Date   WBC 9.5 02/10/2024   HGB 14.4 02/10/2024   HCT 45.1 02/10/2024   MCV 90 02/10/2024   PLT 300 02/10/2024   No results found for: IRON, TIBC, FERRITIN  Attestation Statements:   Reviewed by clinician on day of visit: allergies, medications, problem list, medical history, surgical history, family history, social history, and previous encounter notes.  Time spent on visit including pre-visit chart review and post-visit care and charting was 28 minutes.   I have reviewed the above documentation for accuracy and completeness, and I agree with the above. -  Victoria Torres d. Shavy Beachem, NP-C

## 2024-05-11 ENCOUNTER — Ambulatory Visit (INDEPENDENT_AMBULATORY_CARE_PROVIDER_SITE_OTHER): Admitting: Adult Health

## 2024-05-11 ENCOUNTER — Encounter (INDEPENDENT_AMBULATORY_CARE_PROVIDER_SITE_OTHER): Payer: Self-pay | Admitting: Adult Health

## 2024-05-11 VITALS — BP 137/82 | HR 70 | Temp 98.7°F | Ht 59.0 in | Wt 137.0 lb

## 2024-05-11 DIAGNOSIS — E669 Obesity, unspecified: Secondary | ICD-10-CM | POA: Diagnosis not present

## 2024-05-11 DIAGNOSIS — E538 Deficiency of other specified B group vitamins: Secondary | ICD-10-CM | POA: Diagnosis not present

## 2024-05-11 DIAGNOSIS — I10 Essential (primary) hypertension: Secondary | ICD-10-CM | POA: Diagnosis not present

## 2024-05-11 DIAGNOSIS — Z6830 Body mass index (BMI) 30.0-30.9, adult: Secondary | ICD-10-CM

## 2024-05-11 DIAGNOSIS — E7800 Pure hypercholesterolemia, unspecified: Secondary | ICD-10-CM | POA: Diagnosis not present

## 2024-05-11 DIAGNOSIS — Z6827 Body mass index (BMI) 27.0-27.9, adult: Secondary | ICD-10-CM | POA: Diagnosis not present

## 2024-05-11 DIAGNOSIS — E559 Vitamin D deficiency, unspecified: Secondary | ICD-10-CM

## 2024-05-11 NOTE — Progress Notes (Signed)
 "    WEIGHT SUMMARY AND BIOMETRICS  Vitals Temp: 98.7 F (37.1 C) BP: 137/82 Pulse Rate: 70 SpO2: 99 %   Anthropometric Measurements Height: 4' 11 (1.499 m) Weight: 137 lb (62.1 kg) BMI (Calculated): 27.66 Weight at Last Visit: 143lb Weight Lost Since Last Visit: 6lb Weight Gained Since Last Visit: 0lb Starting Weight: 153lb Total Weight Loss (lbs): 16 lb (7.258 kg) Peak Weight: 209lb Waist Measurement : 37.5 inches   Body Composition  Body Fat %: 38 % Fat Mass (lbs): 52.4 lbs Muscle Mass (lbs): 81 lbs Total Body Water (lbs): 58.2 lbs Visceral Fat Rating : 10   Other Clinical Data RMR: 1109 Fasting: No Labs: No Today's Visit #: 4 Starting Date: 02/10/24    Chief Complaint:   OBESITY Victoria Torres is here to discuss her progress with her obesity treatment plan.  She is on the the Category 1 Plan and states she is following her eating plan approximately 98 % of the time.  She states she is exercising Gym 45-60 minutes 5 times per week.  Interim History:  If the gym is closed she will walk hills at local [place]  Cravings- nighttime cravings dramatically   Exercise-Ellipitical for 45 mins: she will travel 3 miles and expend 300 calories during this activity. She has added walking on indoor track after the elliptical- active recovery  Hydration-she estimates to drink   05/11/24 11:00  Visceral Fat Rating  10   Visceral Rating at goal!  Subjective:   1. Hypertension, essential BP stable at OV She denies CP with exertion She is on benazepril (LOTENSIN) 20 MG tablet  aspirin EC 81 MG tablet  ezetimibe (ZETIA) 10 MG tablet  atorvastatin  (LIPITOR) 20 MG tablet   She has chronic f/u with established Cardiologist next month  2. Hypercholesterolemia Lipid Panel     Component Value Date/Time   CHOL 145 02/10/2024 1010   TRIG 96 02/10/2024 1010   HDL 69 02/10/2024 1010   CHOLHDL 2.3 04/08/2023 0829   LDLCALC 58 02/10/2024 1010   LABVLDL 18 02/10/2024  1010   The 10-year ASCVD risk score (Arnett DK, et al., 2019) is: 14.2%   Values used to calculate the score:     Age: 73 years     Clinically relevant sex: Female     Is Non-Hispanic African American: No     Diabetic: No     Tobacco smoker: No     Systolic Blood Pressure: 137 mmHg     Is BP treated: Yes     HDL Cholesterol: 69 mg/dL     Total Cholesterol: 145 mg/dL   Cards manages daily Atorvastatin  20g and Zetia 10mg   3. B12 nutritional deficiency - mild She endorses stable energy levels  Latest Reference Range & Units 02/10/24 10:10  Vitamin B12 232 - 1,245 pg/mL 447   4. Vitamin D  deficiency She endorses stable energy levels  Latest Reference Range & Units 02/10/24 10:10  Vitamin D , 25-Hydroxy 30.0 - 100.0 ng/mL 73.1   Vit D Level stable and at goal  Assessment/Plan:   1. Hypertension, essential (Primary) Continue healthy eating, regular exercise, and f/u with Cards next month  2. Hypercholesterolemia Continue healthy eating, regular exercise, and f/u with Cards next month  3. B12 nutritional deficiency - mild Monitor Labs  4. Vitamin D  deficiency Monitor Labs  5. BMI 30.0-30.9,adult, CURRENT BMI 27.8  Victoria Torres is currently in the action stage of change. As such, her goal is to continue with weight loss efforts. She  has agreed to the Category 1 Plan.   Exercise goals: Older adults should follow the adult guidelines. When older adults cannot meet the adult guidelines, they should be as physically active as their abilities and conditions will allow.  Older adults should do exercises that maintain or improve balance if they are at risk of falling.  Older adults should determine their level of effort for physical activity relative to their level of fitness.  Older adults with chronic conditions should understand whether and how their conditions affect their ability to do regular physical activity safely.  Behavioral modification strategies: increasing lean protein intake,  decreasing simple carbohydrates, increasing vegetables, increasing water intake, meal planning and cooking strategies, keeping healthy foods in the home, ways to avoid boredom eating, and planning for success.  Victoria Torres has agreed to follow-up with our clinic in 4 weeks. She was informed of the importance of frequent follow-up visits to maximize her success with intensive lifestyle modifications for her multiple health conditions.   Check Fasting Labs at next OV  Objective:   Blood pressure 137/82, pulse 70, temperature 98.7 F (37.1 C), height 4' 11 (1.499 m), weight 137 lb (62.1 kg), SpO2 99%. Body mass index is 27.67 kg/m.  General: Cooperative, alert, well developed, in no acute distress. HEENT: Conjunctivae and lids unremarkable. Cardiovascular: Regular rhythm.  Lungs: Normal work of breathing. Neurologic: No focal deficits.   Lab Results  Component Value Date   CREATININE 0.70 02/10/2024   BUN 17 02/10/2024   NA 141 02/10/2024   K 4.1 02/10/2024   CL 101 02/10/2024   CO2 24 02/10/2024   Lab Results  Component Value Date   ALT 18 02/10/2024   AST 21 02/10/2024   ALKPHOS 70 02/10/2024   BILITOT 0.5 02/10/2024   Lab Results  Component Value Date   HGBA1C 5.9 (H) 02/10/2024   Lab Results  Component Value Date   INSULIN  13.5 02/10/2024   Lab Results  Component Value Date   TSH 1.900 02/10/2024   Lab Results  Component Value Date   CHOL 145 02/10/2024   HDL 69 02/10/2024   LDLCALC 58 02/10/2024   TRIG 96 02/10/2024   CHOLHDL 2.3 04/08/2023   Lab Results  Component Value Date   VD25OH 73.1 02/10/2024   Lab Results  Component Value Date   WBC 9.5 02/10/2024   HGB 14.4 02/10/2024   HCT 45.1 02/10/2024   MCV 90 02/10/2024   PLT 300 02/10/2024   No results found for: IRON, TIBC, FERRITIN  Attestation Statements:   Reviewed by clinician on day of visit: allergies, medications, problem list, medical history, surgical history, family history, social  history, and previous encounter notes.  Time spent on visit including pre-visit chart review and post-visit care and charting was 25 minutes.   I have reviewed the above documentation for accuracy and completeness, and I agree with the above. -  Oliviya Gilkison d. Kayleigh Broadwell, NP-C "

## 2024-05-25 DIAGNOSIS — Z9622 Myringotomy tube(s) status: Secondary | ICD-10-CM | POA: Insufficient documentation

## 2024-05-25 DIAGNOSIS — I6529 Occlusion and stenosis of unspecified carotid artery: Secondary | ICD-10-CM | POA: Insufficient documentation

## 2024-05-25 DIAGNOSIS — K829 Disease of gallbladder, unspecified: Secondary | ICD-10-CM | POA: Insufficient documentation

## 2024-05-25 DIAGNOSIS — H9190 Unspecified hearing loss, unspecified ear: Secondary | ICD-10-CM | POA: Insufficient documentation

## 2024-05-25 DIAGNOSIS — K5900 Constipation, unspecified: Secondary | ICD-10-CM | POA: Insufficient documentation

## 2024-05-25 DIAGNOSIS — J302 Other seasonal allergic rhinitis: Secondary | ICD-10-CM | POA: Insufficient documentation

## 2024-05-25 DIAGNOSIS — F419 Anxiety disorder, unspecified: Secondary | ICD-10-CM | POA: Insufficient documentation

## 2024-05-25 DIAGNOSIS — K5790 Diverticulosis of intestine, part unspecified, without perforation or abscess without bleeding: Secondary | ICD-10-CM | POA: Insufficient documentation

## 2024-05-25 DIAGNOSIS — R12 Heartburn: Secondary | ICD-10-CM | POA: Insufficient documentation

## 2024-05-26 ENCOUNTER — Ambulatory Visit

## 2024-05-28 NOTE — Progress Notes (Unsigned)
 " Cardiology Office Note:    Date:  05/30/2024   ID:  ZELTA ENFIELD, DOB 07/07/1950, MRN [id number]  PCP:  Erick Greig LABOR, NP  Cardiologist:  Redell Leiter, MD    Referring MD: Erick Greig LABOR, NP    ASSESSMENT:    1. Mild CAD   2. Agatston coronary artery calcium  score between 200 and 399   3. Hypercholesterolemia   4. Hypertension, essential   5. Pulmonary nodules    PLAN:    In order of problems listed above:  Remarkable improvement with lifestyle modification [place]  Heart Association class I continue treatment including aspirin combined loop with a atorvastatin  and Zetia. LDL at target continue current treatment labs followed in her PCP office Well-controlled continue her ACE inhibitor Follow-up CT showed no concerning nodule.   Next appointment: 1 year with me   Medication Adjustments/Labs and Tests Ordered: Current medicines are reviewed at length with the patient today.  Concerns regarding medicines are outlined above.  No orders of the defined types were placed in this encounter.  No orders of the defined types were placed in this encounter.    History of Present Illness:    Victoria Torres is a 74 y.o. female with a hx of mild nonobstructive CAD on cardiac CTA 2000 hypertension and hyperlipidemia last seen 04/07/2023.  Labs performed in her PCP office 02/10/2024 creatinine 0.7 GFR 93 cc/min liver function test normal cholesterol 145 LDL 58 non-HDL cholesterol 76.  Compliance with diet, lifestyle and medications: Yes  She has had a remarkable change in her life she exercises today meticulous with diet has lost 25 pounds and feels markedly improved She tolerates combined lipid-lowering therapy statin and Zetia no muscle pain or weakness She had no angina chest pain edema shortness of breath palp potation or syncope I did not repeat an EKG today Past Medical History:  Diagnosis Date   Adult-onset obesity    Allergic rhinitis    Anxiety    Atherosclerosis of  native coronary artery of native heart without angina pectoris    BMI 32.0-32.9,adult    Constipation    Diverticulosis    Dyslipidemia    Dysphagia    Fatigue 02/18/2022   Gallbladder problem    GERD without esophagitis    Hearing problem    Heartburn    History of placement of ear tubes    Hypercholesterolemia 11/11/2021   Hypertension, essential    Hypertensive disorder 11/11/2021   Lung nodules 11/27/2023   Mild atherosclerosis of carotid artery    Postmenopausal    Postoperative examination 01/25/2016   Seasonal allergies    SOBOE (shortness of breath on exertion)    Vitamin D  deficiency    Vitamin D  deficiency     Current Medications: Active Medications[1]    EKGs/Labs/Other Studies Reviewed:    The following studies were reviewed today:          Recent Labs: 02/10/2024: ALT 18; BUN 17; Creatinine, Ser 0.70; Hemoglobin 14.4; Platelets 300; Potassium 4.1; Sodium 141; TSH 1.900  Recent Lipid Panel    Component Value Date/Time   CHOL 145 02/10/2024 1010   TRIG 96 02/10/2024 1010   HDL 69 02/10/2024 1010   CHOLHDL 2.3 04/08/2023 0829   LDLCALC 58 02/10/2024 1010    Physical Exam:    VS:  BP 120/70   Pulse 74   Ht 4' 11 (1.499 m)   Wt 141 lb (64 kg)   SpO2 98%   BMI 28.48 kg/m  Wt Readings from Last 3 Encounters:  05/30/24 141 lb (64 kg)  05/11/24 137 lb (62.1 kg)  04/12/24 143 lb (64.9 kg)     GEN:  Well nourished, well developed in no acute distress HEENT: Normal NECK: No JVD; No carotid bruits LYMPHATICS: No lymphadenopathy CARDIAC: RRR, no murmurs, rubs, gallops RESPIRATORY:  Clear to auscultation without rales, wheezing or rhonchi  ABDOMEN: Soft, non-tender, non-distended MUSCULOSKELETAL:  No edema; No deformity  SKIN: Warm and dry NEUROLOGIC:  Alert and oriented x 3 PSYCHIATRIC:  Normal affect    Signed, Redell Leiter, MD  05/30/2024 2:23 PM    [facility]      [1]  Current Meds  Medication Sig    aspirin EC 81 MG tablet Take 81 mg by mouth daily.   atorvastatin  (LIPITOR) 20 MG tablet Take 1 tablet (20 mg total) by mouth daily.   benazepril (LOTENSIN) 20 MG tablet Take 20 mg by mouth 2 (two) times daily.   busPIRone (BUSPAR) 5 MG tablet Take 5 mg by mouth 2 (two) times daily.   Calcium  Carb-Cholecalciferol 500-3.125 MG-MCG TABS Take 1 tablet by mouth daily.   Cholecalciferol (VITAMIN D3) 1000 units CAPS Take 2 capsules by mouth daily.   dicyclomine (BENTYL) 10 MG capsule as needed for spasms.   ezetimibe (ZETIA) 10 MG tablet Take 10 mg by mouth daily.   fluticasone (FLONASE) 50 MCG/ACT nasal spray Place 1 spray into both nostrils as needed for allergies or rhinitis.   loratadine (CLARITIN) 10 MG tablet Take 1 tablet by mouth daily.   Multiple Vitamin (MULTIVITAMIN PO) Take 1 Pools by mouth daily.   omeprazole (PRILOSEC) 20 MG capsule Take 20 mg by mouth daily.   Probiotic Product (PROBIOTIC PO) Take 1 tablet by mouth daily.   psyllium (METAMUCIL) 58.6 % packet Take 1 packet by mouth daily.   UBIQUINOL PO Take by mouth.   "

## 2024-05-30 ENCOUNTER — Encounter: Payer: Self-pay | Admitting: Cardiology

## 2024-05-30 ENCOUNTER — Ambulatory Visit: Attending: Cardiology | Admitting: Cardiology

## 2024-05-30 VITALS — BP 120/70 | HR 74 | Ht 59.0 in | Wt 141.0 lb

## 2024-05-30 DIAGNOSIS — I10 Essential (primary) hypertension: Secondary | ICD-10-CM

## 2024-05-30 DIAGNOSIS — I2510 Atherosclerotic heart disease of native coronary artery without angina pectoris: Secondary | ICD-10-CM | POA: Diagnosis not present

## 2024-05-30 DIAGNOSIS — R931 Abnormal findings on diagnostic imaging of heart and coronary circulation: Secondary | ICD-10-CM | POA: Diagnosis not present

## 2024-05-30 DIAGNOSIS — E7800 Pure hypercholesterolemia, unspecified: Secondary | ICD-10-CM

## 2024-05-30 DIAGNOSIS — R918 Other nonspecific abnormal finding of lung field: Secondary | ICD-10-CM | POA: Diagnosis not present

## 2024-05-30 NOTE — Patient Instructions (Signed)

## 2024-06-09 ENCOUNTER — Ambulatory Visit (INDEPENDENT_AMBULATORY_CARE_PROVIDER_SITE_OTHER): Admitting: Adult Health

## 2024-06-10 ENCOUNTER — Encounter (INDEPENDENT_AMBULATORY_CARE_PROVIDER_SITE_OTHER): Payer: Self-pay | Admitting: Adult Health

## 2024-06-10 ENCOUNTER — Ambulatory Visit (INDEPENDENT_AMBULATORY_CARE_PROVIDER_SITE_OTHER): Admitting: Adult Health

## 2024-06-10 VITALS — BP 138/89 | HR 83 | Temp 98.2°F | Ht 59.0 in | Wt 134.0 lb

## 2024-06-10 DIAGNOSIS — Z6827 Body mass index (BMI) 27.0-27.9, adult: Secondary | ICD-10-CM

## 2024-06-10 DIAGNOSIS — E559 Vitamin D deficiency, unspecified: Secondary | ICD-10-CM

## 2024-06-10 DIAGNOSIS — Z6830 Body mass index (BMI) 30.0-30.9, adult: Secondary | ICD-10-CM

## 2024-06-10 DIAGNOSIS — R7303 Prediabetes: Secondary | ICD-10-CM | POA: Diagnosis not present

## 2024-06-10 DIAGNOSIS — E669 Obesity, unspecified: Secondary | ICD-10-CM

## 2024-06-10 DIAGNOSIS — I10 Essential (primary) hypertension: Secondary | ICD-10-CM

## 2024-06-10 DIAGNOSIS — E7800 Pure hypercholesterolemia, unspecified: Secondary | ICD-10-CM | POA: Diagnosis not present

## 2024-06-10 NOTE — Progress Notes (Signed)
 "    WEIGHT SUMMARY AND BIOMETRICS  Vitals Temp: 98.2 F (36.8 C) BP: 138/89 Pulse Rate: 83 SpO2: 99 %   Anthropometric Measurements Height: 4' 11 (1.499 m) Weight: 134 lb (60.8 kg) BMI (Calculated): 27.05 Weight at Last Visit: 137lb Weight Lost Since Last Visit: 3lb Weight Gained Since Last Visit: 0lb Starting Weight: 153lb Total Weight Loss (lbs): 19 lb (8.618 kg) Peak Weight: 209lb Waist Measurement : 37.5 inches   Body Composition  Body Fat %: 37.3 % Fat Mass (lbs): 50 lbs Muscle Mass (lbs): 79.8 lbs Total Body Water (lbs): 58.2 lbs Visceral Fat Rating : 10   Other Clinical Data RMR: 1109 Fasting: No Labs: no Today's Visit #: 5 Starting Date: 02/10/24    Chief Complaint:   OBESITY Zaidee is here to discuss her progress with her obesity treatment plan.  She is on the the Category 1 Plan and states she is following her eating plan approximately 99 % of the time.  She states she is exercising Walking/Stationary Bike 40-60 minutes 3-5 times per week.  Interim History:  05/30/2024 Chronic f/u with established Cardiologist   11/26/23 11:00  Height 4' 11 (1.499 m)  Weight 157 lb (71.2 kg)  BMI (Calculated) 31.69    06/10/24 09:00  Height 4' 11 (1.499 m)  Weight 134 lb (60.8 kg)  BMI (Calculated) 27.05  Total Weight Loss (lbs) 19 lb (8.618 kg)   She has been eating healthy, exercising regularly, hydrating with water. She has had remarkable success since starting at [facility]  Subjective:   1. Hypertension, essential BP stable at OV She is on  benazepril (LOTENSIN) 20 MG tablet  aspirin EC 81 MG tablet  ezetimibe (ZETIA) 10 MG tablet  atorvastatin  (LIPITOR) 20 MG tablet   2. Hypercholesterolemia 05/30/2024 Cards OV Notes Expand All Collapse All  Cardiology Office Note:     Date:  05/30/2024    ID:  Evlyn JINNY Shutter, DOB Aug 15, 1950, MRN [id number]   PCP:  Erick Greig LABOR, NP         Cardiologist:  Redell Leiter, MD     Referring MD: Erick Greig LABOR, NP       ASSESSMENT:     1. Mild CAD   2. Agatston coronary artery calcium  score between 200 and 399   3. Hypercholesterolemia   4. Hypertension, essential   5. Pulmonary nodules     PLAN:     In order of problems listed above:   Remarkable improvement with lifestyle modification [place]  Heart Association class I continue treatment including aspirin combined loop with a atorvastatin  and Zetia. LDL at target continue current treatment labs followed in her PCP office Well-controlled continue her ACE inhibitor Follow-up CT showed no concerning nodule.     Next appointment: 1 year with me     Medication Adjustments/Labs and Tests Ordered: Current medicines are reviewed at length with the patient today.  Concerns regarding medicines are outlined above.  No orders of the defined types were placed in this encounter.   No orders of the defined types were placed in this encounter.       History of Present Illness:     KARTHIKA GLASPER is a 74 y.o. female with a hx of mild nonobstructive CAD on cardiac CTA 2000 hypertension and hyperlipidemia last seen 04/07/2023.   Labs performed in her PCP office 02/10/2024 creatinine 0.7 GFR 93 cc/min liver function test normal cholesterol 145 LDL 58 non-HDL cholesterol 76.   Compliance with diet, lifestyle  and medications: Yes   She has had a remarkable change in her life she exercises today meticulous with diet has lost 25 pounds and feels markedly improved She tolerates combined lipid-lowering therapy statin and Zetia no muscle pain or weakness She had no angina chest pain edema shortness of breath palp potation or syncope I did not repeat an EKG today     Lipid Panel     Component Value Date/Time   CHOL 145 02/10/2024 1010   TRIG 96 02/10/2024 1010   HDL 69 02/10/2024 1010   CHOLHDL 2.3 04/08/2023 0829   LDLCALC 58 02/10/2024 1010   LABVLDL 18 02/10/2024 1010    Cardiology manages Lipitor 20mg   3. Prediabetes Lab Results   Component Value Date   HGBA1C 5.9 (H) 02/10/2024    She endorses stable appetite She is not on antidiabetic therapy  4. Vitamin D  deficiency  Latest Reference Range & Units 02/10/24 10:10  Vitamin D , 25-Hydroxy 30.0 - 100.0 ng/mL 73.1   Vit D Level at goal  Assessment/Plan:   1. Hypertension, essential (Primary) Check Labs - Comprehensive metabolic panel with GFR  2. Hypercholesterolemia Check Labs - Lipid panel  3. Prediabetes Check Labs - Hemoglobin A1c - Insulin , random  4. Vitamin D  deficiency Check Labs - VITAMIN D  25 Hydroxy (Vit-D Deficiency, Fractures)  5. BMI 30.0-30.9,adult, CURRENT BMI 27.1  Tobin is currently in the action stage of change. As such, her goal is to continue with weight loss efforts. She has agreed to the Category 1 Plan.   Exercise goals: Older adults should follow the adult guidelines. When older adults cannot meet the adult guidelines, they should be as physically active as their abilities and conditions will allow.  Older adults should do exercises that maintain or improve balance if they are at risk of falling.  Older adults should determine their level of effort for physical activity relative to their level of fitness.  Older adults with chronic conditions should understand whether and how their conditions affect their ability to do regular physical activity safely.  Behavioral modification strategies: increasing lean protein intake, decreasing simple carbohydrates, increasing vegetables, increasing water intake, no skipping meals, meal planning and cooking strategies, keeping healthy foods in the home, ways to avoid boredom eating, and planning for success.  Vendetta has agreed to follow-up with our clinic in 4 weeks. She was informed of the importance of frequent follow-up visits to maximize her success with intensive lifestyle modifications for her multiple health conditions.   Elisia was informed we would discuss her lab results at her next  visit unless there is a critical issue that needs to be addressed sooner. Lady agreed to keep her next visit at the agreed upon time to discuss these results.  Objective:   Blood pressure 138/89, pulse 83, temperature 98.2 F (36.8 C), height 4' 11 (1.499 m), weight 134 lb (60.8 kg), SpO2 99%. Body mass index is 27.06 kg/m.  General: Cooperative, alert, well developed, in no acute distress. HEENT: Conjunctivae and lids unremarkable. Cardiovascular: Regular rhythm.  Lungs: Normal work of breathing. Neurologic: No focal deficits.   Lab Results  Component Value Date   CREATININE 0.70 02/10/2024   BUN 17 02/10/2024   NA 141 02/10/2024   K 4.1 02/10/2024   CL 101 02/10/2024   CO2 24 02/10/2024   Lab Results  Component Value Date   ALT 18 02/10/2024   AST 21 02/10/2024   ALKPHOS 70 02/10/2024   BILITOT 0.5 02/10/2024   Lab Results  Component Value Date   HGBA1C 5.9 (H) 02/10/2024   Lab Results  Component Value Date   INSULIN  13.5 02/10/2024   Lab Results  Component Value Date   TSH 1.900 02/10/2024   Lab Results  Component Value Date   CHOL 145 02/10/2024   HDL 69 02/10/2024   LDLCALC 58 02/10/2024   TRIG 96 02/10/2024   CHOLHDL 2.3 04/08/2023   Lab Results  Component Value Date   VD25OH 73.1 02/10/2024   Lab Results  Component Value Date   WBC 9.5 02/10/2024   HGB 14.4 02/10/2024   HCT 45.1 02/10/2024   MCV 90 02/10/2024   PLT 300 02/10/2024   No results found for: IRON, TIBC, FERRITIN  Attestation Statements:   Reviewed by clinician on day of visit: allergies, medications, problem list, medical history, surgical history, family history, social history, and previous encounter notes.  I have reviewed the above documentation for accuracy and completeness, and I agree with the above. -  Dyllon Henken d. Nicky Kras, NP-C "

## 2024-06-11 LAB — HEMOGLOBIN A1C
Est. average glucose Bld gHb Est-mCnc: 120 mg/dL
Hgb A1c MFr Bld: 5.8 % — ABNORMAL HIGH (ref 4.8–5.6)

## 2024-06-11 LAB — COMPREHENSIVE METABOLIC PANEL WITH GFR
ALT: 19 [IU]/L (ref 0–32)
AST: 20 [IU]/L (ref 0–40)
Albumin: 4.8 g/dL (ref 3.8–4.8)
Alkaline Phosphatase: 74 [IU]/L (ref 49–135)
BUN/Creatinine Ratio: 27 (ref 12–28)
BUN: 19 mg/dL (ref 8–27)
Bilirubin Total: 0.5 mg/dL (ref 0.0–1.2)
CO2: 24 mmol/L (ref 20–29)
Calcium: 9.8 mg/dL (ref 8.7–10.3)
Chloride: 102 mmol/L (ref 96–106)
Creatinine, Ser: 0.7 mg/dL (ref 0.57–1.00)
Globulin, Total: 2.6 g/dL (ref 1.5–4.5)
Glucose: 93 mg/dL (ref 70–99)
Potassium: 4.3 mmol/L (ref 3.5–5.2)
Sodium: 143 mmol/L (ref 134–144)
Total Protein: 7.4 g/dL (ref 6.0–8.5)
eGFR: 92 mL/min/{1.73_m2}

## 2024-06-11 LAB — LIPID PANEL
Chol/HDL Ratio: 2 ratio (ref 0.0–4.4)
Cholesterol, Total: 138 mg/dL (ref 100–199)
HDL: 68 mg/dL
LDL Chol Calc (NIH): 58 mg/dL (ref 0–99)
Triglycerides: 55 mg/dL (ref 0–149)
VLDL Cholesterol Cal: 12 mg/dL (ref 5–40)

## 2024-06-11 LAB — VITAMIN D 25 HYDROXY (VIT D DEFICIENCY, FRACTURES): Vit D, 25-Hydroxy: 75.8 ng/mL (ref 30.0–100.0)

## 2024-06-11 LAB — INSULIN, RANDOM: INSULIN: 8.4 u[IU]/mL (ref 2.6–24.9)

## 2024-07-14 ENCOUNTER — Ambulatory Visit (INDEPENDENT_AMBULATORY_CARE_PROVIDER_SITE_OTHER): Admitting: Family Medicine
